# Patient Record
Sex: Female | Born: 1975 | Race: White | Hispanic: No | Smoking: Current every day smoker
Health system: Southern US, Academic
[De-identification: ages and names within clinical notes are randomized; demographics above are authoritative.]

## PROBLEM LIST (undated history)

## (undated) DIAGNOSIS — M797 Fibromyalgia: Secondary | ICD-10-CM

## (undated) DIAGNOSIS — F1120 Opioid dependence, uncomplicated: Secondary | ICD-10-CM

## (undated) DIAGNOSIS — E039 Hypothyroidism, unspecified: Secondary | ICD-10-CM

## (undated) HISTORY — PX: HX LAP CHOLECYSTECTOMY: SHX56

## (undated) HISTORY — PX: HX TUBAL LIGATION: SHX77

---

## 1995-07-21 ENCOUNTER — Ambulatory Visit (HOSPITAL_COMMUNITY): Payer: Self-pay | Admitting: Obstetrics & Gynecology

## 1996-02-09 ENCOUNTER — Other Ambulatory Visit: Payer: Self-pay

## 2003-01-14 ENCOUNTER — Emergency Department (HOSPITAL_COMMUNITY): Payer: Self-pay | Admitting: EMERGENCY MEDICINE

## 2003-04-05 ENCOUNTER — Emergency Department (HOSPITAL_COMMUNITY): Payer: Self-pay

## 2004-06-08 ENCOUNTER — Other Ambulatory Visit: Payer: Self-pay | Admitting: Obstetrics & Gynecology

## 2004-07-20 ENCOUNTER — Inpatient Hospital Stay (HOSPITAL_COMMUNITY): Payer: Self-pay | Admitting: Hospital-Specialty Hospital

## 2005-07-12 ENCOUNTER — Other Ambulatory Visit: Payer: Self-pay | Admitting: Obstetrics & Gynecology

## 2017-01-16 ENCOUNTER — Other Ambulatory Visit (HOSPITAL_COMMUNITY): Payer: Self-pay | Admitting: FAMILY PRACTICE

## 2017-01-16 ENCOUNTER — Other Ambulatory Visit
Admission: RE | Admit: 2017-01-16 | Discharge: 2017-01-16 | Disposition: A | Discharge status: Home / Self Care | Payer: HMO | Source: Ambulatory Visit | Attending: FAMILY PRACTICE | Admitting: FAMILY PRACTICE

## 2017-01-16 DIAGNOSIS — Z0000 Encounter for general adult medical examination without abnormal findings: Secondary | ICD-10-CM

## 2017-01-16 DIAGNOSIS — R61 Generalized hyperhidrosis: Secondary | ICD-10-CM

## 2017-01-16 DIAGNOSIS — E039 Hypothyroidism, unspecified: Secondary | ICD-10-CM

## 2017-01-16 LAB — CBC/DIFF - CLIENT CONSOLIDATED
BASOPHIL #: 0.03 x10ˆ3/uL (ref 0.00–0.20)
BASOPHIL %: 0 %
EOSINOPHIL #: 0.14 x10ˆ3/uL (ref 0.00–0.50)
EOSINOPHIL %: 2 %
HCT: 40.5 % (ref 35.9–44.6)
HGB: 13.5 g/dL (ref 12.0–15.3)
LYMPHOCYTE #: 2.98 x10ˆ3/uL (ref 0.90–4.00)
LYMPHOCYTE %: 36 %
MCH: 30.6 pg (ref 27.5–33.2)
MCHC: 33.3 g/dL (ref 31.6–35.5)
MCV: 92 fL (ref 80.0–96.0)
MONOCYTE #: 0.42 x10ˆ3/uL (ref 0.00–0.80)
MONOCYTE %: 5 %
MPV: 10.2 fL (ref 6.7–10.4)
NEUTROPHIL #: 4.76 x10ˆ3/uL (ref 1.50–7.50)
NEUTROPHIL %: 57 %
PLATELETS: 187 x10ˆ3/uL (ref 150–450)
RBC: 4.4 x10ˆ6/uL (ref 4.40–5.40)
RDW: 14.4 % — ABNORMAL HIGH (ref 10.5–13.5)
WBC: 8.3 x10ˆ3/uL
WBC: 8.3 x10ˆ3/uL (ref 4.4–11.0)

## 2017-01-16 LAB — CBC WITH DIFF
BASOPHIL #: 0.03 10*3/uL (ref 0.00–0.20)
BASOPHIL %: 0 %
EOSINOPHIL #: 0.14 10*3/uL (ref 0.00–0.50)
EOSINOPHIL %: 2 %
HCT: 40.5 % (ref 35.9–44.6)
HGB: 13.5 g/dL (ref 12.0–15.3)
LYMPHOCYTE #: 2.98 x10ˆ3/uL (ref 0.90–4.00)
LYMPHOCYTE %: 36 %
LYMPHOCYTE %: 36 %
MCH: 30.6 pg (ref 27.5–33.2)
MCHC: 33.3 g/dL (ref 31.6–35.5)
MCV: 92 fL (ref 80.0–96.0)
MONOCYTE #: 0.42 x10ˆ3/uL (ref 0.00–0.80)
MONOCYTE %: 5 %
MPV: 10.2 fL (ref 6.7–10.4)
NEUTROPHIL #: 4.76 x10ˆ3/uL (ref 1.50–7.50)
NEUTROPHIL %: 57 %
PLATELETS: 187 x10ˆ3/uL (ref 150–450)
RBC: 4.4 10*6/uL (ref 4.40–5.40)
RDW: 14.4 % — ABNORMAL HIGH (ref 10.5–13.5)
WBC: 8.3 10*3/uL (ref 4.4–11.0)

## 2017-01-16 LAB — LIPID PANEL
CHOL/HDL RATIO: 5.3 — ABNORMAL HIGH (ref 0.0–4.4)
CHOLESTEROL: 213 mg/dL — ABNORMAL HIGH (ref 0–199)
HDL CHOL: 40 mg/dL (ref 38–85)
LDL CALC: 120 mg/dL — ABNORMAL HIGH (ref 0–100)
TRIGLYCERIDES: 264 mg/dL — ABNORMAL HIGH (ref 0–149)
VLDL CALC: 53 mg/dL — ABNORMAL HIGH (ref 6–49)

## 2017-01-16 LAB — COMPREHENSIVE METABOLIC PANEL, NON-FASTING
ALBUMIN/GLOBULIN RATIO: 1.1 (ref 1.1–2.2)
ALBUMIN: 4.1 g/dL (ref 3.5–5.0)
ALKALINE PHOSPHATASE: 66 U/L (ref 38–126)
ALT (SGPT): 98 U/L — ABNORMAL HIGH (ref 14–54)
ANION GAP: 10 mmol/L (ref 5–15)
AST (SGOT): 93 U/L — ABNORMAL HIGH (ref 15–41)
BILIRUBIN TOTAL: 0.1 mg/dL — ABNORMAL LOW (ref 0.3–1.2)
BUN/CREA RATIO: 24 — ABNORMAL HIGH (ref 6–20)
BUN: 24 mg/dL (ref 8–26)
CALCIUM: 9.1 mg/dL (ref 8.9–10.3)
CHLORIDE: 104 mmol/L (ref 101–111)
CO2 TOTAL: 25 mmol/L (ref 22–32)
CREATININE: 1.01 mg/dL (ref 0.60–1.10)
ESTIMATED GFR: 60 mL/min/1.73mˆ2 — ABNORMAL LOW (ref >60)
GLUCOSE: 101 mg/dL (ref 70–110)
POTASSIUM: 4 mmol/L (ref 3.6–5.1)
PROTEIN TOTAL: 7.7 g/dL (ref 6.4–8.3)
SODIUM: 139 mmol/L (ref 136–144)

## 2017-01-16 LAB — THYROID STIMULATING HORMONE (SENSITIVE TSH): TSH: 2.8 u[IU]/mL (ref 0.340–5.600)

## 2017-01-16 LAB — HGA1C (HEMOGLOBIN A1C WITH EST AVG GLUCOSE)
ESTIMATED AVERAGE GLUCOSE: 111 mg/dL
HEMOGLOBIN A1C: 5.5 % (ref 4.3–6.1)

## 2017-01-16 LAB — FSH: FSH: 4.1 IU/L

## 2017-01-16 LAB — LH: LUTEINIZING HORMONE: 3.7 IU/L (ref 1.2–8.6)

## 2017-01-18 LAB — LDL CHOLESTEROL, DIRECT: LDL DIRECT: 145 mg/dL — ABNORMAL HIGH (ref <=100)

## 2017-01-20 ENCOUNTER — Other Ambulatory Visit (HOSPITAL_COMMUNITY): Payer: Self-pay | Admitting: FAMILY PRACTICE

## 2017-01-20 ENCOUNTER — Other Ambulatory Visit (HOSPITAL_COMMUNITY)
Admit: 2017-01-20 | Discharge: 2017-01-20 | Disposition: A | Discharge status: Home / Self Care | Payer: Self-pay | Attending: FAMILY PRACTICE | Admitting: FAMILY PRACTICE

## 2017-01-20 DIAGNOSIS — R748 Abnormal levels of other serum enzymes: Secondary | ICD-10-CM

## 2017-12-02 ENCOUNTER — Other Ambulatory Visit: Payer: Self-pay

## 2017-12-02 ENCOUNTER — Encounter (HOSPITAL_COMMUNITY): Payer: Self-pay

## 2017-12-02 ENCOUNTER — Emergency Department
Admission: EM | Admit: 2017-12-02 | Discharge: 2017-12-02 | Disposition: A | Discharge status: Home / Self Care | Payer: HMO | Attending: FAMILY PRACTICE | Admitting: FAMILY PRACTICE

## 2017-12-02 DIAGNOSIS — F17210 Nicotine dependence, cigarettes, uncomplicated: Secondary | ICD-10-CM | POA: Insufficient documentation

## 2017-12-02 DIAGNOSIS — K047 Periapical abscess without sinus: Secondary | ICD-10-CM | POA: Insufficient documentation

## 2017-12-02 MED ORDER — AMOXICILLIN 875 MG-POTASSIUM CLAVULANATE 125 MG TABLET
1.00 | ORAL_TABLET | ORAL | Status: AC
Start: 2017-12-02 — End: 2017-12-02
  Administered 2017-12-02: 1 via ORAL
  Filled 2017-12-02: qty 1

## 2017-12-02 MED ORDER — AMOXICILLIN 875 MG-POTASSIUM CLAVULANATE 125 MG TABLET
1.00 | ORAL_TABLET | Freq: Two times a day (BID) | ORAL | 0 refills | Status: AC
Start: 2017-12-02 — End: 2017-12-12

## 2017-12-02 MED ORDER — KETOROLAC 10 MG TABLET
10.00 mg | ORAL_TABLET | Freq: Three times a day (TID) | ORAL | 0 refills | Status: AC | PRN
Start: 2017-12-02 — End: ?

## 2017-12-02 MED ORDER — KETOROLAC 30 MG/ML (1 ML) INJECTION SOLUTION
30.00 mg | INTRAMUSCULAR | Status: AC
Start: 2017-12-02 — End: 2017-12-02
  Administered 2017-12-02: 30 mg via INTRAMUSCULAR
  Filled 2017-12-02: qty 1

## 2017-12-02 NOTE — Discharge Instructions (Addendum)
Follow up with local dentist as soon as possible. Take medication as prescribed.

## 2017-12-02 NOTE — ED Provider Notes (Signed)
Department of Emergency Medicine  [facility]  12/02/2017        Patient is a 42 y.o.  female presenting to the ED with chief complaint of toothache.     Location: left mandibular tooth  Quality: achy pain  Onset: 3-4 days  Severity: moderate to severe  Timing: still present  Context: poor dentition  Modifying factors: worse with chewing  Associated symptoms: tried Ora-gel; no help  Plans to see a dentist soon    Review of Systems:  As above    Past Medical History:  Past Medical History:  History reviewed. No pertinent past medical history.    Past Surgical History:  History reviewed. No pertinent surgical history.    Social History:  Social History     Tobacco Use   . Smoking status: Current Every Day Smoker     Packs/day: 0.50   . Smokeless tobacco: Never Used   Substance Use Topics   . Alcohol use: Not Currently   . Drug use: Not on file     Social History     Substance and Sexual Activity   Drug Use Not on file       Family History:  No family history on file.      Above history reviewed.  Allergies, medication list, and old records also reviewed.     Physical Exam:  Filed Vitals:    12/02/17 2023   BP: 100/72   Pulse: (!) 110   Resp: 18   Temp: 36.2 C (97.1 F)   SpO2: 97%     Nursing note and vitals reviewed.  Vital signs reviewed as above. No acute distress.   Constitutional: Patient is well-developed and well-nourished.   Head: Normocephalic and atraumatic. Mild swelling of left jaw and tender to palpation.  Eyes: Conjunctivae are normal. Pupils are equal, round, and reactive to light. EOM are intact.  Mouth: Poor dentition with tenderness of left mandibular molars  Neck: Soft, supple, full range of motion.  Pulmonary/Chest: No respiratory distress  Musculoskeletal: Normal range of motion. No deformities.    Neurological: CNs 2-12 grossly intact.  No focal deficits noted.  Skin: No rash or lesions      Workup:     Imaging:         Orders Placed This Encounter   .  amoxicillin-clavulanate (AUGMENTIN) 875-125mg  per tablet   . amoxicillin-pot clavulanate (AUGMENTIN) 875-125 mg Oral Tablet   . ketorolac (TORADOL) 30 mg/mL injection   . ketorolac tromethamine (TORADOL) 10 mg Oral Tablet     MDM:   During the patient's stay in the emergency department, the above listed imaging and/or labs were performed to assist with medical decision making and were reviewed by myself when available for review.     Patient remained stable throughout the emergency department course.      Impression:   1. Left mandibular molar dental abscess    Plan:   RX: Augmentin 875/125 PO BID x 10 days  RX: Toradol 10 mg PO TID prn pain #12  Follow up dentist ASAP

## 2017-12-02 NOTE — ED Nurses Note (Signed)
Patient discharge to home.   Discharge instructions reviewed.  Prescriptions reviewed.

## 2017-12-02 NOTE — ED Triage Notes (Signed)
Toothache on left side of face for three days.  Increased pain at night.

## 2018-08-24 LAB — HISTORICAL SURGICAL PATHOLOGY SPECIMEN

## 2018-12-19 ENCOUNTER — Other Ambulatory Visit: Payer: Self-pay

## 2018-12-19 ENCOUNTER — Other Ambulatory Visit (HOSPITAL_COMMUNITY): Payer: Self-pay

## 2018-12-19 DIAGNOSIS — I4581 Long QT syndrome: Secondary | ICD-10-CM

## 2018-12-21 ENCOUNTER — Ambulatory Visit
Admission: RE | Admit: 2018-12-21 | Discharge: 2018-12-21 | Disposition: A | Discharge status: Home / Self Care | Payer: MEDICAID | Source: Ambulatory Visit | Attending: MULTISPECIALTY CLINIC OR GROUP PRACTICE | Admitting: MULTISPECIALTY CLINIC OR GROUP PRACTICE

## 2018-12-21 ENCOUNTER — Encounter (HOSPITAL_COMMUNITY): Payer: Self-pay

## 2018-12-21 DIAGNOSIS — I4581 Long QT syndrome: Secondary | ICD-10-CM | POA: Insufficient documentation

## 2018-12-22 ENCOUNTER — Other Ambulatory Visit: Payer: Self-pay

## 2021-06-17 ENCOUNTER — Inpatient Hospital Stay (HOSPITAL_COMMUNITY): Payer: MEDICAID | Admitting: Pediatrics

## 2021-06-17 ENCOUNTER — Encounter (HOSPITAL_COMMUNITY): Payer: Self-pay

## 2021-06-17 ENCOUNTER — Inpatient Hospital Stay
Admission: AD | Admit: 2021-06-17 | Discharge: 2021-06-21 | DRG: 863 | Disposition: A | Discharge status: Home Health | Payer: MEDICAID | Source: Other Acute Inpatient Hospital | Attending: Internal Medicine | Admitting: Internal Medicine

## 2021-06-17 ENCOUNTER — Encounter (HOSPITAL_COMMUNITY): Payer: Self-pay | Admitting: Pediatrics

## 2021-06-17 ENCOUNTER — Inpatient Hospital Stay (HOSPITAL_COMMUNITY): Payer: MEDICAID

## 2021-06-17 DIAGNOSIS — S42301S Unspecified fracture of shaft of humerus, right arm, sequela: Secondary | ICD-10-CM

## 2021-06-17 DIAGNOSIS — S51801A Unspecified open wound of right forearm, initial encounter: Secondary | ICD-10-CM

## 2021-06-17 DIAGNOSIS — K5900 Constipation, unspecified: Secondary | ICD-10-CM | POA: Diagnosis present

## 2021-06-17 DIAGNOSIS — N3000 Acute cystitis without hematuria: Secondary | ICD-10-CM | POA: Diagnosis present

## 2021-06-17 DIAGNOSIS — N390 Urinary tract infection, site not specified: Secondary | ICD-10-CM

## 2021-06-17 DIAGNOSIS — S42252A Displaced fracture of greater tuberosity of left humerus, initial encounter for closed fracture: Secondary | ICD-10-CM | POA: Diagnosis present

## 2021-06-17 DIAGNOSIS — L089 Local infection of the skin and subcutaneous tissue, unspecified: Secondary | ICD-10-CM

## 2021-06-17 DIAGNOSIS — S43005S Unspecified dislocation of left shoulder joint, sequela: Secondary | ICD-10-CM

## 2021-06-17 DIAGNOSIS — E876 Hypokalemia: Secondary | ICD-10-CM | POA: Diagnosis present

## 2021-06-17 DIAGNOSIS — T8149XA Infection following a procedure, other surgical site, initial encounter: Principal | ICD-10-CM | POA: Diagnosis present

## 2021-06-17 DIAGNOSIS — F1121 Opioid dependence, in remission: Secondary | ICD-10-CM | POA: Diagnosis present

## 2021-06-17 DIAGNOSIS — B965 Pseudomonas (aeruginosa) (mallei) (pseudomallei) as the cause of diseases classified elsewhere: Secondary | ICD-10-CM | POA: Diagnosis present

## 2021-06-17 DIAGNOSIS — M869 Osteomyelitis, unspecified: Secondary | ICD-10-CM

## 2021-06-17 DIAGNOSIS — D62 Acute posthemorrhagic anemia: Secondary | ICD-10-CM | POA: Diagnosis present

## 2021-06-17 DIAGNOSIS — M797 Fibromyalgia: Secondary | ICD-10-CM

## 2021-06-17 DIAGNOSIS — F17210 Nicotine dependence, cigarettes, uncomplicated: Secondary | ICD-10-CM | POA: Diagnosis present

## 2021-06-17 DIAGNOSIS — E039 Hypothyroidism, unspecified: Secondary | ICD-10-CM

## 2021-06-17 DIAGNOSIS — E038 Other specified hypothyroidism: Secondary | ICD-10-CM | POA: Diagnosis present

## 2021-06-17 DIAGNOSIS — B9620 Unspecified Escherichia coli [E. coli] as the cause of diseases classified elsewhere: Secondary | ICD-10-CM

## 2021-06-17 DIAGNOSIS — F32A Depression, unspecified: Secondary | ICD-10-CM | POA: Diagnosis present

## 2021-06-17 DIAGNOSIS — S42301A Unspecified fracture of shaft of humerus, right arm, initial encounter for closed fracture: Secondary | ICD-10-CM | POA: Diagnosis present

## 2021-06-17 HISTORY — DX: Fibromyalgia: M79.7

## 2021-06-17 HISTORY — DX: Hypothyroidism, unspecified: E03.9

## 2021-06-17 HISTORY — DX: Opioid dependence, uncomplicated (CMS HCC): F11.20

## 2021-06-17 LAB — CBC WITH DIFF
BASOPHIL #: <0.1 10*3/uL (ref <=0.20)
BASOPHIL %: 0 %
EOSINOPHIL #: <0.1 10*3/uL (ref <=0.50)
EOSINOPHIL %: 1 %
HCT: 26.2 % — ABNORMAL LOW (ref 34.8–46.0)
HGB: 8.3 g/dL — ABNORMAL LOW (ref 11.5–16.0)
IMMATURE GRANULOCYTE #: <0.1 10*3/uL (ref <0.10)
IMMATURE GRANULOCYTE %: 1 % (ref 0–1)
LYMPHOCYTE #: 2.5 10*3/uL (ref 1.00–4.80)
LYMPHOCYTE %: 27 %
MCH: 29.1 pg (ref 26.0–32.0)
MCHC: 31.7 g/dL (ref 31.0–35.5)
MCV: 91.9 fL (ref 78.0–100.0)
MONOCYTE #: 0.47 10*3/uL (ref 0.20–1.10)
MONOCYTE %: 5 %
MPV: 10.5 fL (ref 8.7–12.5)
NEUTROPHIL #: 6.17 10*3/uL (ref 1.50–7.70)
NEUTROPHIL %: 66 %
PLATELETS: 236 10*3/uL (ref 150–400)
RBC: 2.85 10*6/uL — ABNORMAL LOW (ref 3.85–5.22)
RDW-CV: 14.6 % (ref 11.5–15.5)
WBC: 9.3 10*3/uL (ref 3.7–11.0)

## 2021-06-17 LAB — HEPATIC FUNCTION PANEL
ALBUMIN: 2.8 g/dL — ABNORMAL LOW (ref 3.5–5.0)
ALKALINE PHOSPHATASE: 101 U/L (ref 40–110)
ALT (SGPT): 17 U/L (ref 8–22)
AST (SGOT): 19 U/L (ref 8–45)
BILIRUBIN DIRECT: 0.1 mg/dL (ref 0.1–0.4)
BILIRUBIN TOTAL: 0.2 mg/dL — ABNORMAL LOW (ref 0.3–1.3)
PROTEIN TOTAL: 7.3 g/dL (ref 6.4–8.3)

## 2021-06-17 LAB — BASIC METABOLIC PANEL
ANION GAP: 14 mmol/L — ABNORMAL HIGH (ref 4–13)
BUN/CREA RATIO: 8 (ref 6–22)
BUN: 9 mg/dL (ref 8–25)
CALCIUM: 9.8 mg/dL (ref 8.5–10.0)
CHLORIDE: 102 mmol/L (ref 96–111)
CO2 TOTAL: 27 mmol/L (ref 22–30)
CREATININE: 1.07 mg/dL — ABNORMAL HIGH (ref 0.60–1.05)
ESTIMATED GFR: 64 mL/min/BSA (ref >=60)
GLUCOSE: 83 mg/dL (ref 65–125)
POTASSIUM: 3.1 mmol/L — ABNORMAL LOW (ref 3.5–5.1)
SODIUM: 143 mmol/L (ref 136–145)

## 2021-06-17 LAB — MAGNESIUM: MAGNESIUM: 1.9 mg/dL (ref 1.8–2.6)

## 2021-06-17 LAB — THYROID STIMULATING HORMONE WITH FREE T4 REFLEX: TSH: 3.071 u[IU]/mL (ref 0.430–3.550)

## 2021-06-17 LAB — C-REACTIVE PROTEIN(CRP),INFLAMMATION: CRP INFLAMMATION: 84.3 mg/L — ABNORMAL HIGH (ref <8.0)

## 2021-06-17 LAB — SEDIMENTATION RATE: ERYTHROCYTE SEDIMENTATION RATE (ESR): 110 mm/hr — ABNORMAL HIGH (ref 0–20)

## 2021-06-17 MED ORDER — PIPERACILLIN-TAZOBACTAM 4.5 GRAM INTRAVENOUS SOLUTION
4.5000 g | INTRAVENOUS | Status: AC
Start: 2021-06-17 — End: 2021-06-17
  Administered 2021-06-17: 0 g via INTRAVENOUS
  Administered 2021-06-17: 4.5 g via INTRAVENOUS
  Filled 2021-06-17: qty 20

## 2021-06-17 MED ORDER — SENNOSIDES 8.6 MG-DOCUSATE SODIUM 50 MG TABLET
1.0000 | ORAL_TABLET | Freq: Two times a day (BID) | ORAL | Status: DC
Start: 2021-06-18 — End: 2021-06-21
  Administered 2021-06-18 – 2021-06-21 (×7): 1 via ORAL
  Filled 2021-06-17 (×7): qty 1

## 2021-06-17 MED ORDER — POTASSIUM CHLORIDE ER 20 MEQ TABLET,EXTENDED RELEASE(PART/CRYST)
40.0000 meq | ORAL_TABLET | ORAL | Status: AC
Start: 2021-06-17 — End: 2021-06-17
  Administered 2021-06-17: 40 meq via ORAL
  Filled 2021-06-17: qty 2

## 2021-06-17 MED ORDER — SODIUM CHLORIDE 0.9 % (FLUSH) INJECTION SYRINGE
2.0000 mL | INJECTION | INTRAMUSCULAR | Status: DC | PRN
Start: 2021-06-17 — End: 2021-06-21

## 2021-06-17 MED ORDER — OXYCODONE 10 MG TABLET
10.0000 mg | ORAL_TABLET | ORAL | Status: DC | PRN
Start: 2021-06-17 — End: 2021-06-18
  Administered 2021-06-17 – 2021-06-18 (×2): 10 mg via ORAL
  Filled 2021-06-17 (×2): qty 1

## 2021-06-17 MED ORDER — ACETAMINOPHEN 325 MG TABLET
650.0000 mg | ORAL_TABLET | Freq: Four times a day (QID) | ORAL | Status: DC
Start: 2021-06-18 — End: 2021-06-21
  Administered 2021-06-17 – 2021-06-18 (×4): 650 mg via ORAL
  Administered 2021-06-19: 0 mg via ORAL
  Administered 2021-06-19 (×2): 650 mg via ORAL
  Administered 2021-06-19: 0 mg via ORAL
  Administered 2021-06-19 – 2021-06-21 (×8): 650 mg via ORAL
  Filled 2021-06-17 (×14): qty 2

## 2021-06-17 MED ORDER — SODIUM CHLORIDE 0.9 % (FLUSH) INJECTION SYRINGE
2.0000 mL | INJECTION | Freq: Three times a day (TID) | INTRAMUSCULAR | Status: DC
Start: 2021-06-17 — End: 2021-06-21
  Administered 2021-06-17: 6 mL
  Administered 2021-06-18: 2 mL
  Administered 2021-06-18: 0 mL
  Administered 2021-06-18: 6 mL
  Administered 2021-06-19: 2 mL
  Administered 2021-06-19: 3 mL
  Administered 2021-06-19: 2 mL
  Administered 2021-06-20: 0 mL
  Administered 2021-06-20 (×2): 2 mL
  Administered 2021-06-21: 3 mL
  Administered 2021-06-21: 2 mL

## 2021-06-17 MED ORDER — DEXTROSE 5% IN WATER (D5W) FLUSH BAG - 250 ML
INTRAVENOUS | Status: DC | PRN
Start: 2021-06-17 — End: 2021-06-21

## 2021-06-17 MED ORDER — HYDROMORPHONE 1 MG/ML INJECTION WRAPPER
0.2000 mg | INJECTION | INTRAMUSCULAR | Status: DC | PRN
Start: 2021-06-17 — End: 2021-06-18
  Administered 2021-06-18 (×2): 0.2 mg via INTRAVENOUS
  Filled 2021-06-17 (×2): qty 1

## 2021-06-17 MED ORDER — OXYCODONE 5 MG TABLET
5.0000 mg | ORAL_TABLET | ORAL | Status: DC | PRN
Start: 2021-06-17 — End: 2021-06-18

## 2021-06-17 MED ORDER — HEPARIN (PORCINE) 5,000 UNIT/ML INJECTION SOLUTION
5000.0000 [IU] | Freq: Three times a day (TID) | INTRAMUSCULAR | Status: DC
Start: 2021-06-18 — End: 2021-06-18
  Administered 2021-06-18: 5000 [IU] via SUBCUTANEOUS
  Filled 2021-06-17: qty 1

## 2021-06-17 MED ORDER — ONDANSETRON HCL (PF) 4 MG/2 ML INJECTION SOLUTION
4.0000 mg | Freq: Four times a day (QID) | INTRAMUSCULAR | Status: DC | PRN
Start: 2021-06-17 — End: 2021-06-21

## 2021-06-17 MED ORDER — ACETAMINOPHEN 325 MG TABLET
650.0000 mg | ORAL_TABLET | ORAL | Status: DC | PRN
Start: 2021-06-17 — End: 2021-06-17

## 2021-06-17 MED ORDER — PIPERACILLIN-TAZOBACTAM 4.5 GRAM INTRAVENOUS SOLUTION
4.5000 g | Freq: Three times a day (TID) | INTRAVENOUS | Status: DC
Start: 2021-06-18 — End: 2021-06-18
  Administered 2021-06-18: 4.5 g via INTRAVENOUS
  Administered 2021-06-18: 0 g via INTRAVENOUS
  Filled 2021-06-17: qty 20

## 2021-06-17 MED ORDER — POLYETHYLENE GLYCOL 3350 17 GRAM ORAL POWDER PACKET
17.0000 g | Freq: Every day | ORAL | Status: DC
Start: 2021-06-18 — End: 2021-06-20
  Administered 2021-06-18: 0 g via ORAL
  Administered 2021-06-19 – 2021-06-20 (×2): 17 g via ORAL
  Filled 2021-06-17 (×3): qty 1

## 2021-06-17 MED ORDER — SODIUM CHLORIDE 0.9% FLUSH BAG - 250 ML
INTRAVENOUS | Status: DC | PRN
Start: 2021-06-17 — End: 2021-06-21

## 2021-06-17 NOTE — Nurses Notes (Signed)
Page sent to Denzil Magnuson, MD, regarding pts pain level. Requested a call back.
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
## 2021-06-17 NOTE — Care Plan (Signed)
Problem: Adult Inpatient Plan of Care  Goal: Plan of Care Review  Outcome: Outcome Achieved  Goal: Patient-Specific Goal (Individualized)  Outcome: Outcome Achieved  Flowsheets (Taken 06/17/2021 2140)  Patient would like to participate in bedside shift report: Yes  Individualized Care Needs: pain control  Anxieties, Fears or Concerns: pain control  Patient-Specific Goals (Include Timeframe): pain control  Plan of Care Reviewed With: patient  Goal: Absence of Hospital-Acquired Illness or Injury  Outcome: Outcome Achieved  Intervention: Identify and Manage Fall Risk  Recent Flowsheet Documentation  Taken 06/17/2021 2140 by Brennan [name], RN  Safety Promotion/Fall Prevention: safety round/check completed  Intervention: Prevent Skin Injury  Recent Flowsheet Documentation  Taken 06/17/2021 2140 by Brennan [name], RN  Body Position: positioned/repositioned independently  Skin Protection: adhesive use limited  Intervention: Prevent and Manage VTE (Venous Thromboembolism) Risk  Recent Flowsheet Documentation  Taken 06/17/2021 2140 by Brennan [name], RN  VTE Prevention/Management:   ambulation promoted   compression stockings on  Intervention: Prevent Infection  Recent Flowsheet Documentation  Taken 06/17/2021 2140 by Brennan [name], RN  Infection Prevention:   promote handwashing   rest/sleep promoted  Goal: Optimal Comfort and Wellbeing  Outcome: Outcome Achieved  Intervention: Provide Person-Centered Care  Recent Flowsheet Documentation  Taken 06/17/2021 2140 by Brennan [name], RN  Trust Relationship/Rapport: care explained  Goal: Rounds/Family Conference  Outcome: Outcome Achieved     Problem: Fall Injury Risk  Goal: Absence of Fall and Fall-Related Injury  Outcome: Outcome Achieved  Intervention: Identify and Manage Contributors  Recent Flowsheet Documentation  Taken 06/17/2021 2140 by Brennan [name], RN  Self-Care Promotion: independence encouraged  Intervention: Promote Injury-Free Environment  Recent Flowsheet  Documentation  Taken 06/17/2021 2140 by Brennan [name], RN  Safety Promotion/Fall Prevention: safety round/check completed     Problem: Pain Acute  Goal: Optimal Pain Control and Function  Outcome: Outcome Achieved
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
## 2021-06-18 DIAGNOSIS — N3000 Acute cystitis without hematuria: Secondary | ICD-10-CM

## 2021-06-18 DIAGNOSIS — Z720 Tobacco use: Secondary | ICD-10-CM

## 2021-06-18 DIAGNOSIS — S42301A Unspecified fracture of shaft of humerus, right arm, initial encounter for closed fracture: Secondary | ICD-10-CM

## 2021-06-18 DIAGNOSIS — Z5181 Encounter for therapeutic drug level monitoring: Secondary | ICD-10-CM

## 2021-06-18 DIAGNOSIS — K5900 Constipation, unspecified: Secondary | ICD-10-CM

## 2021-06-18 DIAGNOSIS — Z6825 Body mass index (BMI) 25.0-25.9, adult: Secondary | ICD-10-CM

## 2021-06-18 DIAGNOSIS — F17200 Nicotine dependence, unspecified, uncomplicated: Secondary | ICD-10-CM

## 2021-06-18 LAB — CBC WITH DIFF
BASOPHIL #: <0.1 10*3/uL (ref <=0.20)
BASOPHIL %: 1 %
EOSINOPHIL #: 0.12 10*3/uL (ref <=0.50)
EOSINOPHIL %: 2 %
HCT: 25.5 % — ABNORMAL LOW (ref 34.8–46.0)
HGB: 7.8 g/dL — ABNORMAL LOW (ref 11.5–16.0)
IMMATURE GRANULOCYTE #: <0.1 10*3/uL (ref <0.10)
IMMATURE GRANULOCYTE %: 1 % (ref 0–1)
LYMPHOCYTE #: 2.35 10*3/uL (ref 1.00–4.80)
LYMPHOCYTE %: 29 %
MCH: 28.4 pg (ref 26.0–32.0)
MCHC: 30.6 g/dL — ABNORMAL LOW (ref 31.0–35.5)
MCV: 92.7 fL (ref 78.0–100.0)
MONOCYTE #: 0.37 10*3/uL (ref 0.20–1.10)
MONOCYTE %: 5 %
MPV: 10.6 fL (ref 8.7–12.5)
NEUTROPHIL #: 5.2 10*3/uL (ref 1.50–7.70)
NEUTROPHIL %: 62 %
PLATELETS: 212 10*3/uL (ref 150–400)
RBC: 2.75 10*6/uL — ABNORMAL LOW (ref 3.85–5.22)
RDW-CV: 14.9 % (ref 11.5–15.5)
WBC: 8.1 10*3/uL (ref 3.7–11.0)

## 2021-06-18 LAB — BASIC METABOLIC PANEL
ANION GAP: 8 mmol/L (ref 4–13)
BUN/CREA RATIO: 12 (ref 6–22)
BUN: 14 mg/dL (ref 8–25)
CALCIUM: 9.1 mg/dL (ref 8.5–10.0)
CHLORIDE: 104 mmol/L (ref 96–111)
CO2 TOTAL: 31 mmol/L — ABNORMAL HIGH (ref 22–30)
CREATININE: 1.13 mg/dL — ABNORMAL HIGH (ref 0.60–1.05)
ESTIMATED GFR: 60 mL/min/BSA (ref >=60)
GLUCOSE: 100 mg/dL (ref 65–125)
POTASSIUM: 3.9 mmol/L (ref 3.5–5.1)
SODIUM: 143 mmol/L (ref 136–145)

## 2021-06-18 LAB — ECG 12-LEAD
Atrial Rate: 72 {beats}/min
Calculated P Axis: 51 degrees
Calculated R Axis: 20 degrees
Calculated T Axis: 34 degrees
PR Interval: 124 ms
QRS Duration: 78 ms
QT Interval: 380 ms
QTC Calculation: 416 ms
Ventricular rate: 72 {beats}/min

## 2021-06-18 LAB — URINALYSIS, MICROSCOPIC
RBCS: 85 /hpf — ABNORMAL HIGH (ref <6.0)
WBCS: 4 /hpf (ref <11.0)

## 2021-06-18 LAB — URINALYSIS, MACROSCOPIC
BILIRUBIN: NEGATIVE mg/dL
COLOR: NORMAL
GLUCOSE: NEGATIVE mg/dL
KETONES: NEGATIVE mg/dL
LEUKOCYTES: NEGATIVE WBCs/uL
NITRITE: NEGATIVE
PH: 7 (ref 5.0–8.0)
PROTEIN: NEGATIVE mg/dL
SPECIFIC GRAVITY: 1.01 (ref 1.005–1.030)
UROBILINOGEN: NEGATIVE mg/dL

## 2021-06-18 LAB — HCG, PLASMA OR SERUM QUANTITATIVE, PREGNANCY: HCG QUANTITATIVE PREGNANCY: <2 m[IU]/mL (ref <10)

## 2021-06-18 MED ORDER — METHOCARBAMOL 750 MG TABLET
750.0000 mg | ORAL_TABLET | Freq: Four times a day (QID) | ORAL | Status: DC
Start: 2021-06-18 — End: 2021-06-21
  Administered 2021-06-18 – 2021-06-19 (×5): 750 mg via ORAL
  Administered 2021-06-19: 0 mg via ORAL
  Administered 2021-06-19 – 2021-06-20 (×2): 750 mg via ORAL
  Administered 2021-06-20: 0 mg via ORAL
  Administered 2021-06-20: 750 mg via ORAL
  Administered 2021-06-20: 0 mg via ORAL
  Administered 2021-06-21 (×3): 750 mg via ORAL
  Filled 2021-06-18 (×13): qty 1

## 2021-06-18 MED ORDER — OXYCODONE 20 MG/ML ORAL CONCENTRATE
10.0000 mg | ORAL | Status: DC | PRN
Start: 2021-06-18 — End: 2021-06-18
  Administered 2021-06-18: 10 mg via ORAL
  Filled 2021-06-18: qty 0.5

## 2021-06-18 MED ORDER — NICOTINE 7 MG/24 HR DAILY TRANSDERMAL PATCH
7.0000 mg | MEDICATED_PATCH | Freq: Every day | TRANSDERMAL | Status: DC
Start: 2021-06-18 — End: 2021-06-21
  Administered 2021-06-18 – 2021-06-21 (×4): 7 mg via TRANSDERMAL
  Filled 2021-06-18 (×4): qty 1

## 2021-06-18 MED ORDER — KETOROLAC 15 MG/ML INJECTION SOLUTION
15.0000 mg | Freq: Four times a day (QID) | INTRAMUSCULAR | Status: AC | PRN
Start: 2021-06-18 — End: 2021-06-21
  Administered 2021-06-18 – 2021-06-21 (×11): 15 mg via INTRAVENOUS
  Filled 2021-06-18 (×11): qty 1

## 2021-06-18 MED ORDER — METHADONE 5 MG TABLET
125.0000 mg | ORAL_TABLET | Freq: Every day | ORAL | Status: DC
Start: 2021-06-18 — End: 2021-06-18

## 2021-06-18 MED ORDER — CEFEPIME 2 GRAM SOLUTION FOR INJECTION
2.0000 g | Freq: Two times a day (BID) | INTRAMUSCULAR | Status: DC
Start: 2021-06-19 — End: 2021-06-21
  Administered 2021-06-19 (×2): 2 g via INTRAVENOUS
  Administered 2021-06-19 – 2021-06-20 (×3): 0 g via INTRAVENOUS
  Administered 2021-06-20 (×2): 2 g via INTRAVENOUS
  Administered 2021-06-20 – 2021-06-21 (×2): 0 g via INTRAVENOUS
  Administered 2021-06-21: 2 g via INTRAVENOUS
  Administered 2021-06-21: 0 g via INTRAVENOUS
  Administered 2021-06-21: 2 g via INTRAVENOUS
  Filled 2021-06-18 (×6): qty 12.5

## 2021-06-18 MED ORDER — LIDOCAINE 3.6 %-MENTHOL 1.25 % TOPICAL PATCH
1.0000 | MEDICATED_PATCH | Freq: Every day | CUTANEOUS | Status: DC
Start: 2021-06-18 — End: 2021-06-21
  Administered 2021-06-18 – 2021-06-21 (×4): 1 via TRANSDERMAL
  Filled 2021-06-18 (×4): qty 1

## 2021-06-18 MED ORDER — OXYCODONE 20 MG/ML ORAL CONCENTRATE
20.0000 mg | ORAL | Status: DC | PRN
Start: 2021-06-18 — End: 2021-06-21
  Administered 2021-06-19 – 2021-06-21 (×11): 20 mg via ORAL
  Filled 2021-06-18 (×11): qty 1

## 2021-06-18 MED ORDER — OXYCODONE 20 MG/ML ORAL CONCENTRATE
5.0000 mg | ORAL | Status: DC | PRN
Start: 2021-06-18 — End: 2021-06-18

## 2021-06-18 MED ORDER — MORPHINE CONCENTRATE 10 MG/0.5 ML ORAL SYRINGE (FOR ORAL USE ONLY)
25.0000 mg | INJECTION | ORAL | Status: DC | PRN
Start: 2021-06-18 — End: 2021-06-18

## 2021-06-18 MED ORDER — ENOXAPARIN 40 MG/0.4 ML SUBCUTANEOUS SYRINGE
40.0000 mg | INJECTION | SUBCUTANEOUS | Status: DC
Start: 2021-06-18 — End: 2021-06-21
  Administered 2021-06-18 – 2021-06-21 (×4): 40 mg via SUBCUTANEOUS
  Filled 2021-06-18 (×4): qty 0.4

## 2021-06-18 MED ORDER — METHADONE 10 MG/ML ORAL CONCENTRATE
125.0000 mg | Freq: Every day | ORAL | Status: DC
Start: 2021-06-18 — End: 2021-06-21
  Administered 2021-06-18 – 2021-06-21 (×4): 125 mg via ORAL
  Filled 2021-06-18 (×4): qty 15

## 2021-06-18 MED ORDER — OXYCODONE 20 MG/ML ORAL CONCENTRATE
15.0000 mg | ORAL | Status: DC | PRN
Start: 2021-06-18 — End: 2021-06-21
  Administered 2021-06-18 – 2021-06-19 (×3): 15 mg via ORAL
  Filled 2021-06-18 (×3): qty 1

## 2021-06-18 MED ORDER — SODIUM CHLORIDE 0.9 % INTRAVENOUS PIGGYBACK
2.0000 g | INJECTION | INTRAVENOUS | Status: AC
Start: 2021-06-18 — End: 2021-06-18
  Administered 2021-06-18: 0 g via INTRAVENOUS
  Administered 2021-06-18: 2 g via INTRAVENOUS
  Filled 2021-06-18: qty 12.5

## 2021-06-18 MED ORDER — NICOTINE (POLACRILEX) 2 MG GUM
2.0000 mg | CHEWING_GUM | BUCCAL | Status: DC | PRN
Start: 2021-06-18 — End: 2021-06-21

## 2021-06-18 MED ORDER — METRONIDAZOLE 250 MG TABLET
500.0000 mg | ORAL_TABLET | Freq: Two times a day (BID) | ORAL | Status: DC
Start: 2021-06-18 — End: 2021-06-21
  Administered 2021-06-18 – 2021-06-21 (×7): 500 mg via ORAL
  Filled 2021-06-18 (×7): qty 2

## 2021-06-18 MED ORDER — MORPHINE CONCENTRATE 10 MG/0.5 ML ORAL SYRINGE (FOR ORAL USE ONLY)
20.0000 mg | INJECTION | ORAL | Status: DC | PRN
Start: 2021-06-18 — End: 2021-06-18
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
## 2021-06-18 NOTE — Transitional Care (Addendum)
Per Hospitalist service, records for cultures requested from [place] for the past month.  Called [phone] and left a message and request sent via fax as well to [phone].  Pollyann Glen, LPN  03/30/5619, 30:86      Your fax to [phone] was completed successfully.    To: Jetty Peeks  ([id number])  Pages: 1           JobID: [id number]    Records received and scanned into media.  Hospitalist service notified via secure chat.  Pollyann Glen, LPN  04/24/6438, 34:74

## 2021-06-18 NOTE — Pharmacy (Addendum)
Methadone Dose Confirmation    [facility]- [phone] on 06/18/21:        - Confirmed Methadone dose (with [facility]): 125 mg daily       - Per clinic, patient last seen: 05/22/21, received 27 take home doses at that time.       - Per patient last dose taken: 06/17/21 @ 1000    Nani Skillern, [place]  06/18/2021, 11:16

## 2021-06-18 NOTE — Care Plan (Signed)
Goal: Activity - ambulate OOB, PT/OT, ROM  Goal: Safety - OOB with FWW, fall prevention education, call bell within reach  Goal: Infection control - IV abx therapy, hand hygiene, patient education  Goal: Pain control - Reposition, PRN Oxycodone/Toradol  Goal: Skin integrity - encourage frequent weight shift, nutritional intake  Goal: DVT prevention - Ambulation, SCD's, TED hose, Lovenox  Goal: D/C - pending abx      Patient admitted for right humerus I&D. Patient alert and oriented. VSS. +CMS in all extremities.  Dressing to right humerus cdi. Patient pain being managed with PRN Oxycodone. Patient ambulates OOB ad lib. Patient tolerating diet well with no c/o nausea. Patient is tolerating IV ABX well. PT/OT recommending home. Patient educated on policies. POC reviewed. Call bell within reach. D/C pending abx.      Problem: Orthopaedic Fracture  Goal: Optimal Functional Ability  Intervention: Optimize Functional Ability  Recent Flowsheet Documentation  Taken 06/18/2021 1608 by Cristina Gong, RN  Self-Care Promotion:   independence encouraged   BADL personal objects within reach
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
## 2021-06-18 NOTE — Care Management Notes (Signed)
[facility] Management Initial Evaluation    Patient Name: Brenda [name]  Date of Birth: [birth year]  Sex: female  Date/Time of Admission: 06/17/2021  9:07 PM  Room/Bed: 11/A  Payor: [place] / Plan: HEALTH PLAN MEDICAID / Product Type: Medicaid [place] /   Primary Care Providers:  Talbert Cage, MD, MD (General)    Pharmacy Info:   Preferred [facility] [address] [place]    [place] [address]    Phone: [phone] Fax: [phone]    Hours: Not open 24 hours          Emergency Contact Info:   Extended Emergency Contact Information  Primary Emergency Contact: [facility]  Mobile Phone: [phone]  Relation: Daughter  Preferred language: English  Interpreter needed? No  Secondary Emergency Contact: [place] Phone: [phone]  Work Phone: [phone]  Mobile Phone: [phone]  Relation: Other  Preferred language: [place] needed? No    History:   Brenda [name] is a 46 y.o., female, admitted with Infection of humerus.    Height/Weight: 162.6 cm (5' 4.02") / 68.3 kg (150 lb 9.2 oz)     LOS: 1 day   Admitting Diagnosis: Infection of humerus (CMS [facility]) [M86.9]    Assessment:      06/18/21 0913   Assessment Details   Assessment Type Admission   Date of Care Management Update 06/18/21   Date of Next DCP Update 06/21/21   Readmission   Is this a readmission? No   Insurance Information/Type   Insurance type Medicaid   Employment/Financial   Patient has Prescription Coverage?  Yes   Living Environment   Lives With parent(s)   Living Arrangements house   Able to Return to Prior Arrangements yes   Home Safety   Home Assessment: No Problems Identified   Home Accessibility no concerns   Care Management Plan   Discharge Planning Status initial meeting   Projected Discharge Date 06/21/21   Discharge plan discussed with: Patient   CM will  evaluate for rehabilitation potential no   Patient choice offered to patient/family no   Form for patient choice reviewed/signed and on chart no   Patient aware of possible cost for ambulance transport?  No   Discharge Needs Assessment   Equipment Currently Used at Home none   Equipment Needed After Discharge none   Discharge Facility/Level of Care Needs Home (Patient/Family Member/other)(code 1)   Transportation Available car;family or friend will provide   Referral Information   Admission Type inpatient   Address Verified verified-no changes   Arrived From home or self-care;admitted as an inpatient   ADVANCE DIRECTIVES   Does the Patient have an Advance Directive? No, Information Offered and Refused   Patient Requests Assistance in Having Advance Directive Notarized. N/A   LAY CAREGIVER    Appointed Lay Caregiver? I Decline         Discharge Plan:  Home (Patient/Family Member/other) (code 1)  Patient resides with her mother in a 3 story home. No DME or Home Health services prior to admission.  MSW confirmed address, No pharmacy since closed, and PCP information. Patient has prescription coverage and transportation home upon discharge via mother.      The patient will continue to be evaluated for developing discharge needs.     Case Manager: Quillian Quince  [place], [place]  Phone: [phone]

## 2021-06-19 DIAGNOSIS — S42252A Displaced fracture of greater tuberosity of left humerus, initial encounter for closed fracture: Secondary | ICD-10-CM

## 2021-06-19 DIAGNOSIS — F1120 Opioid dependence, uncomplicated: Secondary | ICD-10-CM

## 2021-06-19 DIAGNOSIS — S49001G Unspecified physeal fracture of upper end of humerus, right arm, subsequent encounter for fracture with delayed healing: Secondary | ICD-10-CM

## 2021-06-19 DIAGNOSIS — Z9889 Other specified postprocedural states: Secondary | ICD-10-CM

## 2021-06-19 DIAGNOSIS — D649 Anemia, unspecified: Secondary | ICD-10-CM

## 2021-06-19 DIAGNOSIS — M79601 Pain in right arm: Secondary | ICD-10-CM

## 2021-06-19 LAB — CBC WITH DIFF
BASOPHIL #: <0.1 10*3/uL (ref <=0.20)
BASOPHIL %: 1 %
EOSINOPHIL #: 0.16 10*3/uL (ref <=0.50)
EOSINOPHIL %: 2 %
HCT: 27.1 % — ABNORMAL LOW (ref 34.8–46.0)
HGB: 8.2 g/dL — ABNORMAL LOW (ref 11.5–16.0)
IMMATURE GRANULOCYTE #: <0.1 10*3/uL (ref <0.10)
IMMATURE GRANULOCYTE %: 1 % (ref 0–1)
LYMPHOCYTE #: 1.78 10*3/uL (ref 1.00–4.80)
LYMPHOCYTE %: 23 %
MCH: 28.2 pg (ref 26.0–32.0)
MCHC: 30.3 g/dL — ABNORMAL LOW (ref 31.0–35.5)
MCV: 93.1 fL (ref 78.0–100.0)
MONOCYTE #: 0.39 10*3/uL (ref 0.20–1.10)
MONOCYTE %: 5 %
MPV: 10.2 fL (ref 8.7–12.5)
NEUTROPHIL #: 5.25 10*3/uL (ref 1.50–7.70)
NEUTROPHIL %: 68 %
PLATELETS: 233 10*3/uL (ref 150–400)
RBC: 2.91 10*6/uL — ABNORMAL LOW (ref 3.85–5.22)
RDW-CV: 14.7 % (ref 11.5–15.5)
WBC: 7.7 10*3/uL (ref 3.7–11.0)

## 2021-06-19 LAB — BASIC METABOLIC PANEL
ANION GAP: 8 mmol/L (ref 4–13)
BUN/CREA RATIO: 18 (ref 6–22)
BUN: 20 mg/dL (ref 8–25)
CALCIUM: 9.3 mg/dL (ref 8.5–10.0)
CHLORIDE: 104 mmol/L (ref 96–111)
CO2 TOTAL: 28 mmol/L (ref 22–30)
CREATININE: 1.12 mg/dL — ABNORMAL HIGH (ref 0.60–1.05)
ESTIMATED GFR: 61 mL/min/BSA (ref >=60)
GLUCOSE: 102 mg/dL (ref 65–125)
POTASSIUM: 3.6 mmol/L (ref 3.5–5.1)
SODIUM: 140 mmol/L (ref 136–145)

## 2021-06-19 LAB — C-REACTIVE PROTEIN(CRP),INFLAMMATION: CRP INFLAMMATION: 43.8 mg/L — ABNORMAL HIGH (ref <8.0)

## 2021-06-19 MED ORDER — LACTULOSE 20 GRAM/30 ML ORAL SOLUTION
30.0000 mL | Freq: Every day | ORAL | Status: DC
Start: 2021-06-20 — End: 2021-06-21
  Administered 2021-06-20: 0 mL via ORAL
  Administered 2021-06-21: 30 mL via ORAL
  Filled 2021-06-19 (×2): qty 30

## 2021-06-19 MED ORDER — LIDOCAINE (PF) 10 MG/ML (1 %) INJECTION SOLUTION
0.5000 mL | Freq: Once | INTRAMUSCULAR | Status: AC
Start: 2021-06-19 — End: 2021-06-19
  Administered 2021-06-19: 0.5 mL via INTRADERMAL

## 2021-06-19 MED ORDER — SODIUM CHLORIDE 0.9 % (FLUSH) INJECTION SYRINGE
20.0000 mL | INJECTION | INTRAMUSCULAR | Status: DC | PRN
Start: 2021-06-19 — End: 2021-06-21

## 2021-06-19 MED ORDER — SODIUM CHLORIDE 0.9 % (FLUSH) INJECTION SYRINGE
10.0000 mL | INJECTION | Freq: Three times a day (TID) | INTRAMUSCULAR | Status: DC
Start: 2021-06-19 — End: 2021-06-21
  Administered 2021-06-19 – 2021-06-20 (×5): 10 mL
  Administered 2021-06-21: 13 mL
  Administered 2021-06-21: 10 mL
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
## 2021-06-19 NOTE — Consults (Signed)
[facility]  Pain Management Consult Service  Follow-up Note      Date of Service:  06/19/2021  Date of Admission:  06/17/2021  Patient: Brenda [name]  Date of Birth:  [birth year], 46 y.o.  MRN: [id number]        Assessment/Recommendations:    -RUE pain s/p ORIF of R humeral fx with I&D      - recommend continuing current regimen.       Subjective:  Pt sleeping in bed. She reports her pain is improved with changes to regimen.       Current Medications:  acetaminophen (TYLENOL) tablet, 650 mg, Oral, Q6HRS  cefepime (MAXIPIME) 2 g in NS 100 mL IVPB minibag, 2 g, Intravenous, Q12H  D5W 250 mL flush bag, , Intravenous, Q15 Min PRN  enoxaparin PF (LOVENOX) 40 mg/0.4 mL SubQ injection, 40 mg, Subcutaneous, Q24H  ketorolac (TORADOL) 15 mg/mL injection, 15 mg, Intravenous, Q6H PRN  lidocaine-menthol (LIDOPATCH) 3.6%-1.25% patch, 1 Patch, Transdermal, Daily  methadone (METHADONE INTENSOL) 10 mg per mL concentrated oral liquid, 125 mg, Oral, Daily  methocarbamol (ROBAXIN) tablet, 750 mg, Oral, 4x/day  metroNIDAZOLE (FLAGYL) tablet, 500 mg, Oral, 2x/day  nicotine (NICODERM CQ) transdermal patch (mg/24 hr), 7 mg, Transdermal, Daily  nicotine polacrilex (NICORETTE) chewing gum, 2 mg, Oral, Q1H PRN  NS 250 mL flush bag, , Intravenous, Q15 Min PRN  NS flush syringe, 2-6 mL, Intracatheter, Q8HRS  NS flush syringe, 2-6 mL, Intracatheter, Q1 MIN PRN  ondansetron (ZOFRAN) 2 mg/mL injection, 4 mg, Intravenous, Q6H PRN  oxyCODONE concentrate (ROXICODONE INTENSOL) 20mg  per mL oral liquid, 15 mg, Oral, Q4H PRN  oxyCODONE concentrate (ROXICODONE INTENSOL) 20mg  per mL oral liquid, 20 mg, Oral, Q4H PRN  polyethylene glycol (MIRALAX) oral packet, 17 g, Oral, Daily  sennosides-docusate sodium (SENOKOT-S) 8.6-50mg  per tablet, 1 Tablet, Oral, 2x/day        Problem List:   Active Hospital Problems    Diagnosis    Primary Problem: Infection of humerus (CMS HCC)       Objective:   Vital Signs:  Filed Vitals:    06/18/21 2140  06/18/21 2220 06/19/21 0358 06/19/21 0801   BP: (!) 162/86 (!) 158/93 (!) 149/74 (!) 149/71   Pulse: 93 91 93 69   Resp: 16 16 16     Temp: 36.9 C (98.4 F) 36.6 C (97.9 F) 36.6 C (97.9 F) 36.6 C (97.9 F)   SpO2: 100% 95% 100% 98%     Body mass index is 25.83 kg/m.    Today's Physical Exam:  General: no distress  Eyes: Conjunctiva clear.  HENT:Mouth mucous membranes moist.   Neck: supple, symmetrical, trachea midline  Lungs: respirations even and unlabored  Extremities: No cyanosis or edema  Skin: Skin warm and dry  Neurologic: Alert and oriented x3  Psychiatric: Normal affect, behavior, memory, thought content, judgement, and speech.       Labs  Please indicate ordered or reviewed)  Reviewed: I have reviewed all lab results.      Diagnostic/ Radiology Tests  Reviewed: N/A            08/19/21, APRN,NP-C 06/19/2021, 08:27            The was visit conducted independently by the provider above.  The note was authored independently by the provider above.  Unless directly noted by me in the chart, I did not evaluate the patient or participate in formulating the plan of care.  Signature required by employer.  Chipper Oman, MD
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
## 2021-06-19 NOTE — Care Plan (Signed)
Problem: VTE (Venous Thromboembolism)  Goal: VTE (Venous Thromboembolism) Symptom Resolution  Outcome: Ongoing (see interventions/notes)  Intervention: Prevent or Manage VTE (Venous Thromboembolism)  Recent Flowsheet Documentation  Taken 06/18/2021 2000 by Norva Karvonen, RN  VTE Prevention/Management:   ambulation promoted   dorsiflexion/plantar flexion performed     Problem: Hypertension Comorbidity  Goal: Blood Pressure in Desired Range  Outcome: Ongoing (see interventions/notes)     Problem: Orthopaedic Fracture  Goal: Absence of Bleeding  Outcome: Ongoing (see interventions/notes)  Intervention: Monitor and Manage Fracture Bleeding  Recent Flowsheet Documentation  Taken 06/18/2021 2000 by Norva Karvonen, RN  Bleeding Management: dressing monitored  Goal: Effective Bowel Elimination  Outcome: Ongoing (see interventions/notes)  Goal: Absence of Embolism Signs and Symptoms  Outcome: Ongoing (see interventions/notes)  Intervention: Prevent or Manage Embolism Risk  Recent Flowsheet Documentation  Taken 06/18/2021 2000 by Norva Karvonen, RN  VTE Prevention/Management:   ambulation promoted   dorsiflexion/plantar flexion performed  Goal: Fracture Stability  Outcome: Ongoing (see interventions/notes)  Goal: Optimal Functional Ability  Outcome: Ongoing (see interventions/notes)  Goal: Absence of Infection Signs and Symptoms  Outcome: Ongoing (see interventions/notes)  Intervention: Prevent or Manage Infection  Recent Flowsheet Documentation  Taken 06/18/2021 2000 by Norva Karvonen, RN  Fever Reduction/Comfort Measures:   lightweight bedding   lightweight clothing  Goal: Effective Tissue Perfusion  Outcome: Ongoing (see interventions/notes)  Goal: Optimal Pain Control and Function  Outcome: Ongoing (see interventions/notes)  Goal: Effective Oxygenation and Ventilation  Outcome: Ongoing (see interventions/notes)  Intervention: Promote Airway Secretion Clearance  Recent Flowsheet Documentation  Taken 06/18/2021 2000 by  Norva Karvonen, RN  Cough And Deep Breathing: done independently per patient     Problem: Fall Injury Risk  Goal: Absence of Fall and Fall-Related Injury  Outcome: Ongoing (see interventions/notes)  Intervention: Promote Injury-Free Environment  Recent Flowsheet Documentation  Taken 06/19/2021 0200 by Norva Karvonen, RN  Safety Promotion/Fall Prevention: safety round/check completed  Taken 06/19/2021 0000 by Norva Karvonen, RN  Safety Promotion/Fall Prevention: safety round/check completed  Taken 06/18/2021 2200 by Norva Karvonen, RN  Safety Promotion/Fall Prevention: safety round/check completed  Taken 06/18/2021 2000 by Norva Karvonen, RN  Safety Promotion/Fall Prevention: safety round/check completed     Problem: Pain Acute  Goal: Optimal Pain Control and Function  Outcome: Ongoing (see interventions/notes)  Intervention: Optimize Psychosocial Wellbeing  Recent Flowsheet Documentation  Taken 06/18/2021 2000 by Norva Karvonen, RN  Diversional Activities: television     Patient admitted on 4/30 for I&D of the R humerus. Patient is alert and oriented x4. Pain managed with PRN medications. Calm and cooperative with care this shift. D/C pending antibiotic selection. Bed in lowest position, wheels locked, call light within reach.
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
## 2021-06-19 NOTE — Discharge Instructions (Signed)
PICC DISCHARGE INSTRUCTIONS      PICC Information:  4 FR Power PICC, Bard SOLO  with 1 lumen(s).  Placed in Left, Brachial Vein with tip location of SVC.   Total Catheter length is 45 cm.  External Catheter length is 3 cm.  Placed on 06/19/21  Placed by Wilnette Kales BSN, RN, VA-BC   [facility] Vascular Access Team:   Office Phone with Voicemail [phone] ext: [phone]  Pager [phone], pager [phone]     Flush Instruction:  use only a syringe; flush with a push; pause motion and saline daily    Blood Sampling Instructions  Flush 2ml saline, waste 2-5 ml, obtain specimen, flush 51ml saline    Dressing Care Instructions:  Stat Lock securement device should be changed weekly with dressing change and as needed.  Change PICC cap/valve weekly and as needed, Use sterile technique, cover PICC and the device securement with transparent dressing at least weekly and as needed. Change first PICC dressing on DATE: 06/26/21      Discharge Recommendations/ Plan:Discharge RW:ERXV with Home Health and Home Infusion      Resources: [facility] (report [id number]) [phone], [facility] [phone]         If there are any other questions you have, or if a medical problem should develop, please call [phone] ([phone]). Our transitions nurses are available to assist you Monday thru Friday from 8 am-4:30 pm. These nurses can help you with questions regarding your discharge instructions. If this is after 4:30 pm, a weekend, or holiday please call and ask to speak to the doctor on call for the Hospitalist team.  In case of an emergency call 911.

## 2021-06-19 NOTE — Care Plan (Signed)
Goal: Activity - ambulate OOB, PT/OT, ROM  Goal: Safety - OOB with FWW, fall prevention education, call bell within reach  Goal: Infection control - IV abx therapy, hand hygiene, patient education  Goal: Pain control - Reposition, PRN Oxycodone/Toradol  Goal: Skin integrity - encourage frequent weight shift, nutritional intake  Goal: DVT prevention - Ambulation, SCD's, TED hose, Lovenox  Goal: D/C - pending abx      Patient admitted for right humerus I&D. Patient alert and oriented. VSS. +CMS in all extremities.  Dressing to right humerus cdi. Patient pain being managed with PRN Oxycodone. Patient ambulates OOB ad lib. Patient tolerating diet well with no c/o nausea. Patient is tolerating IV ABX well. PT/OT recommending home. Patient educated on policies. POC reviewed. Call bell within reach. D/C pending abx.      Problem: Fall Injury Risk  Goal: Absence of Fall and Fall-Related Injury  Intervention: Identify and Manage Contributors  Flowsheets (Taken 06/19/2021 1611)  Self-Care Promotion:   independence encouraged   BADL personal objects within reach  Medication Review/Management: medications reviewed
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
## 2021-06-20 DIAGNOSIS — E038 Other specified hypothyroidism: Secondary | ICD-10-CM

## 2021-06-20 DIAGNOSIS — F329 Major depressive disorder, single episode, unspecified: Secondary | ICD-10-CM

## 2021-06-20 LAB — CBC WITH DIFF
BASOPHIL #: <0.1 10*3/uL (ref <=0.20)
BASOPHIL %: 1 %
EOSINOPHIL #: 0.24 10*3/uL (ref <=0.50)
EOSINOPHIL %: 3 %
HCT: 25.7 % — ABNORMAL LOW (ref 34.8–46.0)
HGB: 8 g/dL — ABNORMAL LOW (ref 11.5–16.0)
IMMATURE GRANULOCYTE #: <0.1 10*3/uL (ref <0.10)
IMMATURE GRANULOCYTE %: 1 % (ref 0–1)
LYMPHOCYTE #: 2.29 10*3/uL (ref 1.00–4.80)
LYMPHOCYTE %: 29 %
MCH: 28.9 pg (ref 26.0–32.0)
MCHC: 31.1 g/dL (ref 31.0–35.5)
MCV: 92.8 fL (ref 78.0–100.0)
MONOCYTE #: 0.44 10*3/uL (ref 0.20–1.10)
MONOCYTE %: 6 %
MPV: 10.4 fL (ref 8.7–12.5)
NEUTROPHIL #: 4.81 10*3/uL (ref 1.50–7.70)
NEUTROPHIL %: 60 %
PLATELETS: 240 10*3/uL (ref 150–400)
RBC: 2.77 10*6/uL — ABNORMAL LOW (ref 3.85–5.22)
RDW-CV: 14.7 % (ref 11.5–15.5)
WBC: 7.9 10*3/uL (ref 3.7–11.0)

## 2021-06-20 LAB — HEPATIC FUNCTION PANEL
ALBUMIN: 2.5 g/dL — ABNORMAL LOW (ref 3.5–5.0)
ALKALINE PHOSPHATASE: 84 U/L (ref 40–110)
ALT (SGPT): 13 U/L (ref 8–22)
AST (SGOT): 18 U/L (ref 8–45)
BILIRUBIN DIRECT: 0.1 mg/dL (ref 0.1–0.4)
BILIRUBIN TOTAL: 0.2 mg/dL — ABNORMAL LOW (ref 0.3–1.3)
PROTEIN TOTAL: 6.6 g/dL (ref 6.4–8.3)

## 2021-06-20 LAB — BASIC METABOLIC PANEL
ANION GAP: 7 mmol/L (ref 4–13)
BUN/CREA RATIO: 23 — ABNORMAL HIGH (ref 6–22)
BUN: 26 mg/dL — ABNORMAL HIGH (ref 8–25)
CALCIUM: 9.3 mg/dL (ref 8.5–10.0)
CHLORIDE: 105 mmol/L (ref 96–111)
CO2 TOTAL: 29 mmol/L (ref 22–30)
CREATININE: 1.11 mg/dL — ABNORMAL HIGH (ref 0.60–1.05)
ESTIMATED GFR: 62 mL/min/BSA (ref >=60)
GLUCOSE: 102 mg/dL (ref 65–125)
POTASSIUM: 3.8 mmol/L (ref 3.5–5.1)
SODIUM: 141 mmol/L (ref 136–145)

## 2021-06-20 LAB — C-REACTIVE PROTEIN(CRP),INFLAMMATION: CRP INFLAMMATION: 26.8 mg/L — ABNORMAL HIGH (ref <8.0)

## 2021-06-20 MED ORDER — BISACODYL 10 MG RECTAL SUPPOSITORY
10.0000 mg | Freq: Once | RECTAL | Status: DC | PRN
Start: 2021-06-20 — End: 2021-06-21

## 2021-06-20 MED ORDER — POLYETHYLENE GLYCOL 3350 17 GRAM ORAL POWDER PACKET
17.0000 g | Freq: Two times a day (BID) | ORAL | Status: DC
Start: 2021-06-20 — End: 2021-06-21
  Administered 2021-06-20 – 2021-06-21 (×2): 17 g via ORAL
  Filled 2021-06-20 (×2): qty 1

## 2021-06-20 NOTE — Consults (Signed)
Outpatient Parenteral Antimicrobial Therapy Program      Patient Name: Brenda [name]  MRN: [id number]  Patient Address: [address] [place] [address]  DOB: [birth year]  Date: 06/20/21    Outpatient Antimicrobial Treatment Plan     Diagnosis Group:  Diagnosis:   Microorganisms:   Bone and joint infection Right humeral fracture  Pseudomonas (wound)  E coli (urine)       Antibiotics  IV Antibiotics:  Dose  Frequency Anticipated Stop date:  Comments:   Cefepime 2gm Every 12 hours  07/29/21    Oral Antibiotics:  Dose Frequency Anticipated Stop date:  Comment:   Metrondazole  500mg  Twice a day 07/29/21    6 weeks therapy     Important drug interactions:      Allergies:   Allergies   Allergen Reactions   . No Known Drug Allergies             Labs monitoring plan/orders  Labs and Frequency Additional Comments   CBC/diff, BUN, Creatinine, Hepatic function panel and Inflammatory CRP Every Monday       OPAT Pharmacist:  Dr. Sunday  Phone Number: [phone] Ext: [phone]  Fax Labs To: [phone]     Physician Monitoring OPAT Course:   Dr. [phone]  Phone Number: [phone]  Fax Labs To: [phone]       ID follow-up:    ID clinic 2-3 weeks after discharge          Attending Physician    Bhushan       If there are any changes to antibiotic plan or new culture results after the OPAT note has been placed, please contact [phone] so we can update the OPAT note.      [place], PharmD, BCIDP  Infectious Diseases Clinical Pharmacist  [phone]
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
## 2021-06-20 NOTE — Care Plan (Signed)
[facility]  Rehabilitation Services  Physical Therapy Progress Note      Patient Name: Brenda [name]  Date of Birth: 03-10-1975  Height:  162.6 cm (5' 4.02")  Weight:  68.3 kg (150 lb 9.2 oz)  Room/Bed: 11/A  Payor: [employer]ICAID / Plan: [employer] MEDICAID / Product Type: Medicaid [facility] /     Assessment:     Pt tolerated mobility assessment well. Completes all transfers and ambulation with SBA, no LOB noted. Demos no further need for acute care PT, please reconsult as needed. Rec d/c to home with assist once medically appropriate.    Discharge Needs:   Equipment Recommendation: none anticipated  Discharge Disposition: home with assist    JUSTIFICATION OF DISCHARGE RECOMMENDATION   Based on current diagnosis, functional performance prior to admission, and current functional performance, this patient requires continued PT services in home with assist in order to achieve significant functional improvements in these deficit areas: gait, locomotion, and balance, muscle performance.      Plan:   Continue to follow patient according to established plan of care.  The risks/benefits of therapy have been discussed with the patient/caregiver and he/she is in agreement with the established plan of care.     Subjective & Objective:        06/20/21 1054   Therapist Pager   PT Assigned/ Pager # [phone]   Rehab Session   Document Type therapy progress note (daily note)   Total PT Minutes: 8   Patient Effort good   Symptoms Noted During/After Treatment none   General Information   Patient Profile Reviewed yes   Medical Lines PIV Line   Respiratory Status room air   Existing Precautions/Restrictions fall precautions;full code;weight bearing restriction   Weight-bearing Status   Left Upper Extremity non weight-bearing (NWB)   Right Upper Extremity non weight-bearing (NWB)   Mutuality/Individual Preferences   Individualized Care Needs OOB ad lib per RN   Plan of Care Reviewed With patient   Pre Treatment Status    Pre Treatment Patient Status Patient sitting on edge of bed;Call light within reach;Telephone within reach;Nurse approved session   Support Present Pre Treatment  None   Communication Pre Treatment  Nurse   Cognitive Assessment/Interventions   Behavior/Mood Observations behavior appropriate to situation, WNL/WFL   Orientation Status oriented x 4   Attention WNL/WFL   Follows Commands WNL   Vital Signs   O2 Delivery Pre Treatment room air   O2 Delivery Post Treatment room air   Vitals Comment no s/s of distress   Pain Assessment   Pre/Posttreatment Pain Comment no c/o pain   Transfer Assessment/Treatment   Sit-Stand Independence supervision required   Stand-Sit Independence supervision required   Gait Assessment/Treatment   Total Distance Ambulated 200   Independence  stand-by assistance   Balance Skill Training   Comment unsupported   Sitting Balance: Static good balance   Sitting, Dynamic (Balance) good balance   Sit-to-Stand Balance good balance   Standing Balance: Static good balance   Standing Balance: Dynamic good balance   Post Treatment Status   Post Treatment Patient Status Patient sitting on edge of bed;Call light within reach;Telephone within reach   Support Present Post Treatment  None   Plan of Care Review   Plan Of Care Reviewed With patient   Basic Mobility Am-PAC/6Clicks Score (APPROVED Staff)   Turning in bed without bedrails 4   Lying on back to sitting on edge of flat bed  4   Moving to and from a bed to a chair 4   Standing up from chair 4   Walk in room 4   Climbing 3-5 steps with railing 4   6 Clicks Raw Score total 24   Standardized (t-scale) score 57.68   CMS 0-100% Score 0   CMS Modifier CH   Patient Mobility Goal (JHHLM) 7- Walk 25 feet or more 3X/day   Exercise/Activity Level Performed 7- Walked 25 feet or more   Physical Therapy Clinical Impression   Assessment Pt tolerated mobility assessment well. Completes all transfers and ambulation with SBA, no LOB noted. Demos no further need for  acute care PT, please reconsult as needed. Rec d/c to home with assist once medically appropriate.   Patient/Family Goals Statement to get better   Criteria for Skilled Therapeutic no problems identified which require skilled intervention   Anticipated Equipment Needs at Discharge (PT) none anticipated   Anticipated Discharge Disposition home with assist         Therapist:   Linford Arnold, PT   Pager #: [phone]
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
## 2021-06-20 NOTE — Care Plan (Signed)
Goal: Activity - ambulate OOB, PT/OT, ROM  Goal: Safety - OOB with FWW, fall prevention education, call bell within reach  Goal: Infection control - IV abx therapy, hand hygiene, patient education  Goal: Pain control - Reposition, PRN Oxycodone/Toradol  Goal: Skin integrity - encourage frequent weight shift, nutritional intake  Goal: DVT prevention - Ambulation, SCD's, TED hose, Lovenox  Goal: D/C - pending abx      Patient admitted for right humerus I&D. Patient alert and oriented. VSS. +CMS in all extremities.  Dressing to right humerus cdi. Patient pain being managed with PRN Oxycodone. Patient ambulates OOB ad lib. Patient tolerating diet well with no c/o nausea. Patient is tolerating IV ABX well. PT/OT recommending home. Patient educated on policies. POC reviewed. Call bell within reach. D/C pending abx.      Problem: Orthopaedic Fracture  Goal: Optimal Functional Ability  Intervention: Optimize Functional Ability  Recent Flowsheet Documentation  Taken 06/20/2021 1704 by Cristina Gong, RN  Self-Care Promotion:   independence encouraged   BADL personal objects within reach

## 2021-06-21 ENCOUNTER — Other Ambulatory Visit: Payer: Self-pay

## 2021-06-21 ENCOUNTER — Non-Acute Institutional Stay: Payer: MEDICAID

## 2021-06-21 LAB — HEPATIC FUNCTION PANEL
ALBUMIN: 3 g/dL — ABNORMAL LOW (ref 3.5–5.0)
ALKALINE PHOSPHATASE: 101 U/L (ref 40–110)
ALT (SGPT): 16 U/L (ref 8–22)
AST (SGOT): 20 U/L (ref 8–45)
BILIRUBIN DIRECT: 0.1 mg/dL (ref 0.1–0.4)
BILIRUBIN TOTAL: 0.2 mg/dL — ABNORMAL LOW (ref 0.3–1.3)
PROTEIN TOTAL: 7.5 g/dL (ref 6.4–8.3)

## 2021-06-21 LAB — CBC WITH DIFF
BASOPHIL #: <0.1 10*3/uL (ref <=0.20)
BASOPHIL %: 1 %
EOSINOPHIL #: 0.36 10*3/uL (ref <=0.50)
EOSINOPHIL %: 4 %
HCT: 29.5 % — ABNORMAL LOW (ref 34.8–46.0)
HGB: 9.2 g/dL — ABNORMAL LOW (ref 11.5–16.0)
IMMATURE GRANULOCYTE #: 0.1 10*3/uL — ABNORMAL HIGH (ref <0.10)
IMMATURE GRANULOCYTE %: 1 % (ref 0–1)
LYMPHOCYTE #: 2.97 10*3/uL (ref 1.00–4.80)
LYMPHOCYTE %: 29 %
MCH: 28.8 pg (ref 26.0–32.0)
MCHC: 31.2 g/dL (ref 31.0–35.5)
MCV: 92.2 fL (ref 78.0–100.0)
MONOCYTE #: 0.46 10*3/uL (ref 0.20–1.10)
MONOCYTE %: 5 %
MPV: 10.2 fL (ref 8.7–12.5)
NEUTROPHIL #: 6.24 10*3/uL (ref 1.50–7.70)
NEUTROPHIL %: 60 %
PLATELETS: 319 10*3/uL (ref 150–400)
RBC: 3.2 10*6/uL — ABNORMAL LOW (ref 3.85–5.22)
RDW-CV: 14.8 % (ref 11.5–15.5)
WBC: 10.2 10*3/uL (ref 3.7–11.0)

## 2021-06-21 LAB — BASIC METABOLIC PANEL
ANION GAP: 7 mmol/L (ref 4–13)
BUN/CREA RATIO: 29 — ABNORMAL HIGH (ref 6–22)
BUN: 28 mg/dL — ABNORMAL HIGH (ref 8–25)
CALCIUM: 9.7 mg/dL (ref 8.5–10.0)
CHLORIDE: 102 mmol/L (ref 96–111)
CO2 TOTAL: 28 mmol/L (ref 22–30)
CREATININE: 0.97 mg/dL (ref 0.60–1.05)
ESTIMATED GFR: 73 mL/min/BSA (ref >=60)
GLUCOSE: 83 mg/dL (ref 65–125)
POTASSIUM: 4.3 mmol/L (ref 3.5–5.1)
SODIUM: 137 mmol/L (ref 136–145)

## 2021-06-21 LAB — MAGNESIUM: MAGNESIUM: 2.1 mg/dL (ref 1.8–2.6)

## 2021-06-21 MED ORDER — NALOXONE 4 MG/ACTUATION NASAL SPRAY
1.0000 | NASAL | 0 refills | Status: AC | PRN
Start: 2021-06-21 — End: ?
  Filled 2021-06-21: qty 2, 1d supply, fill #0

## 2021-06-21 MED ORDER — METHOCARBAMOL 750 MG TABLET
750.0000 mg | ORAL_TABLET | Freq: Four times a day (QID) | ORAL | 0 refills | Status: AC
Start: 2021-06-21 — End: ?
  Filled 2021-06-21: qty 28, 7d supply, fill #0

## 2021-06-21 MED ORDER — OXYCODONE 20 MG/ML ORAL CONCENTRATE
20.0000 mg | Freq: Four times a day (QID) | ORAL | Status: DC | PRN
Start: 2021-06-21 — End: 2021-06-21

## 2021-06-21 MED ORDER — SODIUM CHLORIDE 0.9 % INTRAVENOUS PIGGYBACK
2.0000 g | Freq: Two times a day (BID) | INTRAVENOUS | Status: AC
Start: 2021-06-21 — End: 2021-07-29

## 2021-06-21 MED ORDER — OXYCODONE 20 MG/ML ORAL CONCENTRATE
10.0000 mg | Freq: Four times a day (QID) | ORAL | Status: DC | PRN
Start: 2021-06-21 — End: 2021-06-21
  Administered 2021-06-21: 10 mg via ORAL
  Filled 2021-06-21: qty 0.5

## 2021-06-21 MED ORDER — MORPHINE CONCENTRATE 100 MG/5 ML (20 MG/ML) ORAL SOLUTION
10.0000 mg | Freq: Four times a day (QID) | ORAL | 0 refills | Status: DC | PRN
Start: 2021-06-21 — End: 2021-07-02
  Filled 2021-06-21: qty 4, 2d supply, fill #0

## 2021-06-21 MED ORDER — SENNOSIDES 8.6 MG-DOCUSATE SODIUM 50 MG TABLET
1.0000 | ORAL_TABLET | Freq: Two times a day (BID) | ORAL | Status: AC
Start: 2021-06-21 — End: ?

## 2021-06-21 MED ORDER — OXYCODONE 20 MG/ML ORAL CONCENTRATE
15.0000 mg | Freq: Four times a day (QID) | ORAL | Status: DC | PRN
Start: 2021-06-21 — End: 2021-06-21

## 2021-06-21 MED ORDER — POLYETHYLENE GLYCOL 3350 17 GRAM ORAL POWDER PACKET
17.0000 g | Freq: Every day | ORAL | Status: AC
Start: 2021-06-21 — End: ?

## 2021-06-21 MED ORDER — METRONIDAZOLE 500 MG TABLET
500.0000 mg | ORAL_TABLET | Freq: Two times a day (BID) | ORAL | 0 refills | Status: AC
Start: 2021-06-21 — End: 2021-07-29
  Filled 2021-06-21: qty 76, 38d supply, fill #0

## 2021-06-21 MED ORDER — ACETAMINOPHEN 325 MG TABLET
650.0000 mg | ORAL_TABLET | Freq: Four times a day (QID) | ORAL | Status: AC | PRN
Start: 2021-06-21 — End: ?
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
## 2021-06-21 NOTE — Consults (Signed)
[facility]  Service: Ortho Trauma  Attending: Starr Sinclair  Progress Note  06/21/2021    Name: Brenda [name]  DOB: 10-Apr-1975  MRN: [id number]    [place] Date:06/17/2021  Date: 06/21/2021    SUBJECTIVE:  46 y.o. female resting in bed. No acute events overnight. Pain is well controlled. No new complaints.     OBJECTIVE:  Vitals: BP 132/66   Pulse 82   Temp 36.6 C (97.9 F)   Resp 16   Ht 1.626 m (5' 4.02")   Wt 68.3 kg (150 lb 9.2 oz)   SpO2 97%   BMI 25.83 kg/m     NAD, resting comfortably in bed  Breathing non-labored    RUE:  Incisions c/d/i.  +thumbs up, okay sign, cross IF/MF, flex/extend wrist  SILT (M/U/R) right hand  + radial pulse      PERTINENT LABS:   CBC:   Recent Labs     06/19/21  0530 06/20/21  0550 06/21/21  0442   WBC 7.7 7.9 10.2   HGB 8.2* 8.0* 9.2*   HCT 27.1* 25.7* 29.5*   PLTCNT 233 240 319       ASSESSMENT:  46 y.o. female s/p I&D, ORIF right humerus fx by outside provider.     PLAN:  - No operative intervention planned by orthopaedics.   - PICC line placed yesterday  - Weightbearing: NWB RUE  - PT/OT: ordered; recs home with assist   - Antibiotics: On cefepime, flagyl   - Pain: per primary  - Drain: JP pulled 5/2  - Dressing: located RUE, okay for nursing to reinforce prn  - Diet: no restrictions from ortho  - Dispo: pending abx decision and pt progress  - Follow-up: will schedule at follow up    Jyl Heinz, MD  Orthopaedic Surgery, PGY-3  [facility]   Pager [phone]
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
## 2021-06-21 NOTE — Care Plan (Signed)
[facility]  Rehabilitation Services  Occupational Therapy Progress Note    Patient Name: Brenda [name]  Date of Birth: [birth year]  Height:  162.6 cm (5' 4.02")  Weight:  68.3 kg (150 lb 9.2 oz)  Room/Bed: 11/A  Payor: HEALTH PLAN MEDICAID / Plan: HEALTH PLAN MEDICAID / Product Type: Medicaid MC /     Assessment:    Pt demonstrated improved pain control and ADL performance. Pt states she is feeling much better today and tolerating increased activity. Recommend home with assist.      Discharge Needs:   Equipment Recommendation: none anticipated    Discharge Disposition:  home with assist     Plan:   Continue to follow patient according to established plan of care.  The risks/benefits of therapy have been discussed with the patient/caregiver and he/she is in agreement with the established plan of care.     Subjective & Objective:      06/20/21 1055   Rehab Session   Document Type therapy progress note (daily note)   Total OT Minutes: 8   Patient Effort good   Symptoms Noted During/After Treatment none   General Information   Medical Lines PIV Line   Respiratory Status room air   Existing Precautions/Restrictions fall precautions;full code;weight bearing restriction   Pre Treatment Status   Pre Treatment Patient Status Patient sitting on edge of bed;Telephone within reach;Call light within reach   Support Present Pre Treatment  None   Communication Pre Treatment  Nurse   Mutuality/Individual Preferences   Individualized Care Needs has been up ad lib   Self-Care   Dominant Hand right   Current Activity Tolerance good   Pain Assessment   Pre/Posttreatment Pain Comment reports L UE pain is better controlled currently; did not rate   Coping/Psychosocial Response Interventions   Plan Of Care Reviewed With patient   Cognitive Assessment/Interventions   Behavior/Mood Observations alert;cooperative   Orientation Status oriented x 4   Attention WNL/WFL   Follows Commands WNL   Mobility Assessment/Training    Mobility Comment pt ambulated 200 ft with SB/supervision   Weight-bearing Status   Left Upper Extremity non weight-bearing (NWB)   Right Upper Extremity non weight-bearing (NWB)   Transfer Assessment/Treatment   Sit-Stand Independence supervision required   Stand-Sit Independence supervision required   Lower Body Dressing Assessment/Training   Comment donned B socks with supervision while sitting EOB   Self-Feeding Assessment/Training   Comment feeding self without difficulty upon OT's arrival   Balance Skill Training   Sitting Balance: Static good balance   Sitting, Dynamic (Balance) good balance   Sit-to-Stand Balance good balance   Standing Balance: Static good balance   Standing Balance: Dynamic good balance   Post Treatment Status   Post Treatment Patient Status Patient sitting on edge of bed;Call light within reach;Telephone within reach   Support Present Post Treatment  None   Clinical Impression   Functional Level at Time of Session Pt demonstrated improved pain control and ADL performance. Pt states she is feeling much better today and tolerating increased activity. Recommend home with assist.         Therapist:   Diona Foley, OT  Pager #: [phone]

## 2021-06-21 NOTE — Care Plan (Signed)
Problem: VTE (Venous Thromboembolism)  Goal: VTE (Venous Thromboembolism) Symptom Resolution  Outcome: Ongoing (see interventions/notes)  Intervention: Prevent or Manage VTE (Venous Thromboembolism)  Recent Flowsheet Documentation  Taken 06/20/2021 2000 by Norva Karvonen, RN  VTE Prevention/Management:   ambulation promoted   dorsiflexion/plantar flexion performed     Problem: Hypertension Comorbidity  Goal: Blood Pressure in Desired Range  Outcome: Ongoing (see interventions/notes)     Problem: Orthopaedic Fracture  Goal: Absence of Bleeding  Outcome: Ongoing (see interventions/notes)  Intervention: Monitor and Manage Fracture Bleeding  Recent Flowsheet Documentation  Taken 06/20/2021 2000 by Norva Karvonen, RN  Bleeding Management: dressing monitored  Goal: Effective Bowel Elimination  Outcome: Ongoing (see interventions/notes)  Goal: Absence of Embolism Signs and Symptoms  Outcome: Ongoing (see interventions/notes)  Intervention: Prevent or Manage Embolism Risk  Recent Flowsheet Documentation  Taken 06/20/2021 2000 by Norva Karvonen, RN  VTE Prevention/Management:   ambulation promoted   dorsiflexion/plantar flexion performed  Goal: Fracture Stability  Outcome: Ongoing (see interventions/notes)  Goal: Optimal Functional Ability  Outcome: Ongoing (see interventions/notes)  Goal: Absence of Infection Signs and Symptoms  Outcome: Ongoing (see interventions/notes)  Intervention: Prevent or Manage Infection  Recent Flowsheet Documentation  Taken 06/20/2021 2000 by Norva Karvonen, RN  Fever Reduction/Comfort Measures:   lightweight bedding   lightweight clothing  Goal: Effective Tissue Perfusion  Outcome: Ongoing (see interventions/notes)  Goal: Optimal Pain Control and Function  Outcome: Ongoing (see interventions/notes)  Intervention: Manage Acute Orthopaedic-Related Pain  Recent Flowsheet Documentation  Taken 06/20/2021 2000 by Norva Karvonen, RN  Sleep/Rest Enhancement: awakenings minimized  Goal: Effective  Oxygenation and Ventilation  Outcome: Ongoing (see interventions/notes)  Intervention: Promote Airway Secretion Clearance  Recent Flowsheet Documentation  Taken 06/20/2021 2000 by Norva Karvonen, RN  Cough And Deep Breathing: done independently per patient     Problem: Fall Injury Risk  Goal: Absence of Fall and Fall-Related Injury  Outcome: Ongoing (see interventions/notes)  Intervention: Promote Injury-Free Environment  Recent Flowsheet Documentation  Taken 06/21/2021 0200 by Norva Karvonen, RN  Safety Promotion/Fall Prevention: safety round/check completed  Taken 06/21/2021 0000 by Norva Karvonen, RN  Safety Promotion/Fall Prevention: safety round/check completed  Taken 06/20/2021 2200 by Norva Karvonen, RN  Safety Promotion/Fall Prevention: safety round/check completed  Taken 06/20/2021 2000 by Norva Karvonen, RN  Safety Promotion/Fall Prevention: safety round/check completed     Problem: Pain Acute  Goal: Optimal Pain Control and Function  Outcome: Ongoing (see interventions/notes)  Intervention: Prevent or Manage Pain  Recent Flowsheet Documentation  Taken 06/20/2021 2000 by Norva Karvonen, RN  Sleep/Rest Enhancement: awakenings minimized  Intervention: Optimize Psychosocial Wellbeing  Recent Flowsheet Documentation  Taken 06/20/2021 2000 by Norva Karvonen, RN  Diversional Activities:   television   smartphone     Problem: Surgical Site Infection  Goal: Absence of Infection Signs and Symptoms  Outcome: Ongoing (see interventions/notes)  Intervention: Prevent or Manage Infection  Recent Flowsheet Documentation  Taken 06/20/2021 2000 by Norva Karvonen, RN  Fever Reduction/Comfort Measures:   lightweight bedding   lightweight clothing     Patient admitted on 4/30 for a R humerus I&D. Patient is nonweightbearing to the RUE. Plan is to have 6 weeks of IV antibiotics. PT/OT is recommending home pending selection of antibiotics. Pain managed with PRN medications. Calm and cooperative with care this shift. Bed  in lowest position, wheels locked, call light within reach.

## 2021-06-21 NOTE — Transitional Care (Signed)
[facility] Medicine  Transitional Care Coordination      Initial Assessment  Name: Brenda [name]  Address: [address]  [place] [address]   [address] [place] [address]  Date of Birth: 17-Aug-1975 46 y.o.  Date of Service: 06/21/2021  Lay Caregiver: Billey Co [phone]      1.  Obtained verbal consent to contact and schedule a PCP follow up in 7-14 days per PCP request.   2.  PCP listed as Beecher Mcardle, MD. This is correct per patient.   Last visit: past year. Patient reports she is more established with Bunnie Philips DO, chart updated  3.  Patient appointment preferences: anytime  4.  Transportation to healthcare appointments: Drives - no concerns noted  5.  Patient MyChart status: pending  6. Preferred method of contact: phone  7.  Confirmed patient contact information: yes    Home Phone [phone]   Work Phone [phone]   Mobile [phone]     8.  Obtained consent to speak with  lay caregiver: verbal from pt given  9.  Confirmed patient address listed above:  Yes, phy added, patient states when she leaves she is going to Moms house for a few  10.  Confirmed Pharmacy:  [facility] in [facility]  Preferred Pharmacy     [facility] [phone] [place], [place] - [phone] AT [facility] OF [facility] - Closed [address]    [address] [place] [address]    Phone: [phone] Fax: [phone]    Hours: Not open 24 hours    [facility]    [address] [place] [address]    Phone: [phone] Fax: [phone]    Hours: 24/7            Spoke with patient    to discuss provider of primary care services.  Discussed role of Transitional Care Coordination Team and informed of contact within 2 business days of discharge to assess follow-up plan.    Charolette Forward, LPN  07/26/5914, 38:46

## 2021-06-21 NOTE — Care Management Notes (Signed)
[facility]  Care Management Note    Patient Name: Brenda [name]  Date of Birth: 1976-01-25  Sex: female  Date/Time of Admission: 06/17/2021  9:07 PM  Room/Bed: 11/A  Payor: HEALTH PLAN MEDICAID / Plan: HEALTH PLAN MEDICAID / Product Type: Medicaid MC /    LOS: 4 days   Primary Care Providers:  Beecher Mcardle, MD, MD (General)    Admitting Diagnosis:  Infection of humerus (CMS [facility]) [M86.9]    Assessment:      06/21/21 1321   Assessment Details   Assessment Type Continued Assessment   Date of Care Management Update 06/21/21   Date of Next DCP Update 06/22/21   Insurance Information/Type   Insurance type Medicaid   Care Management Plan   Discharge Planning Status plan in progress   Projected Discharge Date 06/21/21   Discharge plan discussed with: Patient   CM will evaluate for rehabilitation potential yes   Patient choice offered to patient/family yes   Form for patient choice reviewed/signed and on chart yes   Facility or Agency Preferences [facility], [facility]   Discharge Needs Assessment   Discharge Facility/Level of Care Needs Home with Home Health and Home Infusion   Transportation Available car;family or friend will provide     Patient for discharge home today with home health and home infusion. [facility] to completed a teach and delivery of meds/supplies around 6:00 pm, per [facility]. [facility] notified of discharge. Transport via mother. Tim, RN updated to plans. AVS updated.     Discharge Plan:  Home with Home Health and Home Infusion      The patient will continue to be evaluated for developing discharge needs.     Case Manager: Heber Carolina, LGSW  Phone: [phone]

## 2021-06-21 NOTE — Transitional Care (Signed)
[facility]      Transition of Care Coordination      PCP Verification      ?          Name: Brenda [name]      Date of Service: 06/21/2021           1.Contacted Carolin Coy, NP office to verify establishment and determine barriers to follow-up. Carolin Coy, NP verified as patient's PCP. Last visit: past year.      2. Does patient have an upcoming appointment scheduled in the next 30 days?: no     3. Appointment availability within 7-14 days of discharge: yes     4. Hospital discharge follow up scheduled :  07/04/21  Appointment added to AVS or will merge with Discharge if [facility] provider noted    5. PCP agreeable to follow warfarin/INR if applicable: No     6. PCP agreeable to follow home health if applicable: Yes     7. PCP office offers designated personnel for care coordination: No     8. Current PCP office information:      Carolin Coy, NP     Phone: [phone]     Fax: [phone]        Is office information correct? Yes          Odis Hollingshead, LPN  [id number], [id number]
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
## 2021-06-22 ENCOUNTER — Non-Acute Institutional Stay: Payer: MEDICAID

## 2021-06-22 MED ORDER — IBUPROFEN 200 MG TABLET
200.0000 mg | ORAL_TABLET | Freq: Four times a day (QID) | ORAL | Status: AC | PRN
Start: 2021-06-22 — End: ?

## 2021-06-22 NOTE — Care Management Notes (Signed)
Referral Information  ++++++ Placed Provider #1 ++++++  Case Manager: Jamie Minucci  Provider Type: Home Health  Provider Name: [facility]- [place], [place], [place], [place], [place], [place], [place] and [place]  Address:  [address]  [place], [place] [address]  Contact: peggy carr    Phone: [phone] x  Fax:   Fax: [phone]

## 2021-06-22 NOTE — Care Management Notes (Signed)
Referral Information  ++++++ Placed Provider #1 ++++++  Case Manager: Jamie Minucci  Provider Type: Home Infusion  Provider Name: [facility], [facility] - [place], [place]  Address:  [address], [address]  [place], [place] [id number]  Contact:    Fax:   Fax:

## 2021-06-24 ENCOUNTER — Other Ambulatory Visit: Payer: Self-pay
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
## 2021-06-25 ENCOUNTER — Other Ambulatory Visit: Payer: MEDICAID

## 2021-06-25 ENCOUNTER — Other Ambulatory Visit: Payer: Self-pay

## 2021-06-25 ENCOUNTER — Other Ambulatory Visit (INDEPENDENT_AMBULATORY_CARE_PROVIDER_SITE_OTHER): Payer: Self-pay | Admitting: Student in an Organized Health Care Education/Training Program

## 2021-06-25 DIAGNOSIS — M009 Pyogenic arthritis, unspecified: Secondary | ICD-10-CM

## 2021-06-25 NOTE — Progress Notes (Signed)
[facility] nurse reached out to dr Laural Benes said line not drawing but infusing.  I called [facility] [phone] and spoke to nurse manager jennifer and she is aware and said to send order to [facility] and they will get it scheduled for cathflo.  I faxed orderd to [facility] [phone] and spoke to stacey she will ship out tonight and nurse can administer wed.  Pt insurance covers cath flo in home.  Velda Shell  LPN  OPAT Nurse   [phone]

## 2021-06-26 ENCOUNTER — Other Ambulatory Visit: Payer: Self-pay

## 2021-06-26 NOTE — Home Health (Incomplete)
SN for osteomylitis. Upon arrival at home patients mother let this nurse in home. Home noted to be clean and tidy as well as patient. Vitals signs within normal limits. Lungs CTA but decreased. bowel sounds positive x4 quads. PICC line flushes easily and without difficulty but unable to draw blood. Some tenderness with flushing. PICC line noted to be farely new. Area is free from redness and swelling. Educated patient to keep flushing w ith antibiotic as ordered and keep a visual on area and if pain or redness appears then go to ER to be evulated. Patient stated understanding. Cotnacted pateints surgical team to keep them updated on PICC line status. They stated they would contact patient and would discuss options with her. Wound is free from redness and drainage. Patient is aware of financial responsibilty.

## 2021-06-27 ENCOUNTER — Other Ambulatory Visit: Payer: Self-pay

## 2021-06-28 ENCOUNTER — Other Ambulatory Visit: Payer: MEDICAID

## 2021-06-28 ENCOUNTER — Other Ambulatory Visit: Payer: Self-pay

## 2021-06-28 ENCOUNTER — Encounter (INDEPENDENT_AMBULATORY_CARE_PROVIDER_SITE_OTHER): Payer: Self-pay | Admitting: Student in an Organized Health Care Education/Training Program

## 2021-06-28 LAB — ENTER/EDIT OPAT LABS
ALKALINE PHOSPHATASE: 98
ALT (SGPT): 18
AST (SGOT): 22
BASOPHILS: 0.4
BILIRUBIN, TOTAL: 0.4
BUN: 17
C-REACTIVE PROTEIN HIGH SENSITIVITY (INFLAMMATION): 13.1 mg/l — AB (ref 0.0–10)
CREATININE: 0.6
EOSINOPHIL: 1.7
HCT: 29.7
HGB: 9.1
LYMPHOCYTES: 21.7
MONOCYTES: 3.7
PLATELET COUNT: 285
PMN ABS: 7.28
PMN'S: 72.2
WBC: 10.1

## 2021-06-28 NOTE — Progress Notes (Signed)
Outpatient Parenteral Antimicrobial Therapy (OPAT) Note    Indication: R humeral fracture  Antibiotic Regimen: Cefepime 2g every 12 hours, metronidazole 500mg  PO BID   Duration: 6 weeks  End date: 07/29/21    Antibiotic Discharge Reconciliation Note     Discharge Date from [facility]: 06/21/21  Discharged to: Home       Home Infusion: [facility]   Home Health: 08/21/21 [facility]    OPAT note originally written with an end date of 06/28/21 - this was a typo that was subsequently addended. The correct end date 07/29/21, 6 weeks for a bone/joint infection.     [facility] aware of correct end date. Working on prior authorization at this time.     09/28/21, PharmD, BCIDP  Infectious Diseases Clinical Pharmacist  Ext. [phone]

## 2021-06-28 NOTE — Result Encounter Note (Signed)
OPAT Lab Monitoring Note    I have reviewed the patient's OPAT labs.   They are stable when compared to previous values.  We will continue with the current course of treatment.       Keishawna Carranza E. Amberlea Spagnuolo MD  Section of Infectious Diseases  06/28/2021

## 2021-06-29 ENCOUNTER — Other Ambulatory Visit: Payer: MEDICAID

## 2021-06-29 ENCOUNTER — Other Ambulatory Visit: Payer: Self-pay

## 2021-06-29 MED ORDER — HEPARIN LOCK FLUSH (PORCINE) 10 UNIT/ML INTRAVENOUS SOLUTION
5.0000 mL | Freq: Two times a day (BID) | INTRAVENOUS | Status: AC
Start: 2021-06-22 — End: ?

## 2021-06-29 MED ORDER — SODIUM CHLORIDE 0.9 % (FLUSH) INJECTION SYRINGE
10.0000 mL | INJECTION | Freq: Two times a day (BID) | INTRAMUSCULAR | Status: AC
Start: 2021-06-22 — End: ?

## 2021-07-01 ENCOUNTER — Other Ambulatory Visit: Payer: Self-pay

## 2021-07-02 ENCOUNTER — Encounter (INDEPENDENT_AMBULATORY_CARE_PROVIDER_SITE_OTHER): Payer: Self-pay | Admitting: Orthopaedic Trauma

## 2021-07-02 ENCOUNTER — Other Ambulatory Visit: Payer: Self-pay

## 2021-07-02 ENCOUNTER — Ambulatory Visit: Payer: MEDICAID | Attending: Orthopaedic Trauma | Admitting: Orthopaedic Trauma

## 2021-07-02 ENCOUNTER — Ambulatory Visit (HOSPITAL_BASED_OUTPATIENT_CLINIC_OR_DEPARTMENT_OTHER): Payer: MEDICAID | Admitting: Student in an Organized Health Care Education/Training Program

## 2021-07-02 ENCOUNTER — Encounter (INDEPENDENT_AMBULATORY_CARE_PROVIDER_SITE_OTHER): Payer: Self-pay | Admitting: Student in an Organized Health Care Education/Training Program

## 2021-07-02 VITALS — BP 181/89 | HR 85 | Temp 97.0°F | Ht 64.0 in | Wt 153.4 lb

## 2021-07-02 DIAGNOSIS — M869 Osteomyelitis, unspecified: Secondary | ICD-10-CM

## 2021-07-02 DIAGNOSIS — Z6826 Body mass index (BMI) 26.0-26.9, adult: Secondary | ICD-10-CM

## 2021-07-02 DIAGNOSIS — A498 Other bacterial infections of unspecified site: Secondary | ICD-10-CM

## 2021-07-02 DIAGNOSIS — S42301D Unspecified fracture of shaft of humerus, right arm, subsequent encounter for fracture with routine healing: Secondary | ICD-10-CM

## 2021-07-02 DIAGNOSIS — S42309A Unspecified fracture of shaft of humerus, unspecified arm, initial encounter for closed fracture: Secondary | ICD-10-CM | POA: Insufficient documentation

## 2021-07-02 DIAGNOSIS — Z09 Encounter for follow-up examination after completed treatment for conditions other than malignant neoplasm: Secondary | ICD-10-CM

## 2021-07-02 LAB — CBC WITH DIFF
BASOPHIL #: <0.1 10*3/uL (ref <=0.20)
BASOPHIL %: 1 %
EOSINOPHIL #: 0.22 10*3/uL (ref <=0.50)
EOSINOPHIL %: 3 %
HCT: 30.4 % — ABNORMAL LOW (ref 34.8–46.0)
HGB: 9.4 g/dL — ABNORMAL LOW (ref 11.5–16.0)
IMMATURE GRANULOCYTE #: <0.1 10*3/uL (ref <0.10)
IMMATURE GRANULOCYTE %: 0 % (ref 0–1)
LYMPHOCYTE #: 2.37 10*3/uL (ref 1.00–4.80)
LYMPHOCYTE %: 32 %
MCH: 29.2 pg (ref 26.0–32.0)
MCHC: 30.9 g/dL — ABNORMAL LOW (ref 31.0–35.5)
MCV: 94.4 fL (ref 78.0–100.0)
MONOCYTE #: 0.37 10*3/uL (ref 0.20–1.10)
MONOCYTE %: 5 %
MPV: 11 fL (ref 8.7–12.5)
NEUTROPHIL #: 4.38 10*3/uL (ref 1.50–7.70)
NEUTROPHIL %: 59 %
PLATELETS: 246 10*3/uL (ref 150–400)
RBC: 3.22 10*6/uL — ABNORMAL LOW (ref 3.85–5.22)
RDW-CV: 16.3 % — ABNORMAL HIGH (ref 11.5–15.5)
WBC: 7.4 10*3/uL (ref 3.7–11.0)

## 2021-07-02 LAB — HEPATIC FUNCTION PANEL
ALBUMIN: 3.4 g/dL — ABNORMAL LOW (ref 3.5–5.0)
ALKALINE PHOSPHATASE: 115 U/L — ABNORMAL HIGH (ref 40–110)
ALT (SGPT): 13 U/L (ref 8–22)
AST (SGOT): 15 U/L (ref 8–45)
BILIRUBIN DIRECT: <0.1 mg/dL — ABNORMAL LOW (ref 0.1–0.4)
BILIRUBIN TOTAL: 0.2 mg/dL — ABNORMAL LOW (ref 0.3–1.3)
PROTEIN TOTAL: 7.5 g/dL (ref 6.4–8.3)

## 2021-07-02 LAB — BUN: BUN: 23 mg/dL (ref 8–25)

## 2021-07-02 LAB — C-REACTIVE PROTEIN(CRP),INFLAMMATION: CRP INFLAMMATION: 5.4 mg/L (ref <8.0)

## 2021-07-02 LAB — CREATININE WITH EGFR
CREATININE: 0.73 mg/dL (ref 0.60–1.05)
ESTIMATED GFR: >90 mL/min/BSA (ref >=60)

## 2021-07-02 NOTE — Progress Notes (Signed)
PATIENT NAME: Brenda [name], Brenda [name]  HOSPITAL NUMBER:  [id number]  DATE OF SERVICE: 07/02/2021  DATE OF BIRTH:  1975/04/27    PROGRESS NOTE    DATES OF SURGERY:  1. June 14, 2021, I and D and ORIF of right humerus at outside hospital at [facility].    2. June 17, 2021, I and D of right medial elbow.    SUBJECTIVE:  Ms. Duey is here for followup.  Her surgeries were performed by an outside physician after a trial of nonoperative treatment of a humerus fracture.  She did have some positive culture results for Pseudomonas.  She has been on cefepime and Flagyl.  She sees Dr. Babette Relic at 4:00 p.m. today.  In regard to her arm, she has really been not doing too much rehab or therapy with that at this point.  She is getting home health for her PICC line, but had not started any PT.  She has been nonweightbearing as instructed.  She does state she has had some trouble sleeping.    OBJECTIVE:  On physical exam, her incision posteriorly and her medial elbow are clean and dry.  She has a little bit of erythema kind medially, but no fluctuance.  It is a slightly indurated, but improved from when she was in the hospital.  She has intact, radial, and ulnar median nerve distributions in her hand for motor and sensory exam with a 2+ radial pulse.  She has pretty limited elbow motion and forearm motion at this time.  No x-rays were taken today.    IMPRESSION AND PLAN:  We will remove her sutures and place Steri-Strips.  We gave her an order for physical therapy with coffee cup weightbearing lifting limit but range of motion of the shoulder, elbow, forearm, wrist, and hand.  They can work on edema control as well and modalities.  We will see her in 2 weeks with x-rays of the right humerus.  We did suggest melatonin and not napping during the day to try and get her sleep-wake cycle back on track.        Romie Minus, MD  Associate Professor  [place] Department of Orthopaedics              DD:  07/02/2021 15:12:23  DT:   07/02/2021 18:37:46 DG  D#:  [id number]

## 2021-07-03 ENCOUNTER — Other Ambulatory Visit: Payer: Self-pay

## 2021-07-04 ENCOUNTER — Other Ambulatory Visit: Payer: MEDICAID

## 2021-07-04 ENCOUNTER — Encounter (INDEPENDENT_AMBULATORY_CARE_PROVIDER_SITE_OTHER): Payer: Self-pay | Admitting: Student in an Organized Health Care Education/Training Program

## 2021-07-04 ENCOUNTER — Other Ambulatory Visit: Payer: Self-pay

## 2021-07-04 NOTE — Progress Notes (Signed)
Brenda [name]       OPAT Lab Monitoring Note    I have reviewed the patient's labs from 07/02/21 available in EMR pertaining to antimicrobial monitoring.   They are stable when compared to previous values.  CRP has normalized. May be candidate for early PO transition.   ID f/u planned for 07/20/21.     We will continue with the current course of treatment.       Clovis Pu MD  Section of Infectious Diseases  07/04/2021

## 2021-07-04 NOTE — Home Health (Signed)
Patient being seen for Cp assessment, wound care, and lab draw.   Upon arrival patient was sitting in room with mother present. Verified patient by name and DOB. Patient alert and oreinted but sleepy. Vitals obtained and WNl. Wounds dressed this morning by mother per patient. PICC line wouldn't get blood return for LPN last visit. Doc ordered cathflo. I tried to get blood return and got it with no issues. GOt blood from PICC for labs per order. Patient tolerated well. PICC dressing changes using steril technique. No issues. Patient on IV antibiotics. On the bag from [facility] it says end date 5/12. I contacted PCP and they said it should be June 12th. [facility] notfied and updated end date and theyre sending more.    Constitutional: No fever, chills or weakness   Skin: No rash or diaphoresis.  Wounds: Unable to assess. Mother dressed them today.  HENT: No headaches, dizziness or congestion  Eyes: No vision changes.  Cardio: No chest pain, palpitations or leg swelling   Respiratory: No cough, wheezing or SOB.  Lung sounds: CTA  GI:  No nausea, vomiting or stool changes. Bowel sounds present x 4  GU:  No urinary changes, no pain or burning.  MSK: No joint or back pain  Neuro: No seizures or LOC  All other systems reviewed and are negative.    Medications gathered and reviewed with prescriptions and what we have on the medication list.   Allergies reviewed.   Education provided on emergency prepardness, signs and symptoms of infection and when to report to RN or PCP, and on when to go to the ER.   Upon departure patient sitting in recliner. No questions or concerns. PAtient has appt on monday in [place] so her labs will be obtained there.

## 2021-07-04 NOTE — Home Health (Signed)
Upon nurse arrival, pt mother met this nurse at the door, pt was seated in her recliner chair, ID by name and DOB, pt is currently living with her parents for assistance. pt is seen weekly for cp assesment, wound care, PICC line mngt.   Obtained vitals, pt reported pain 5/10 at this time, performed cp assesment, no issues were noted at this time. pt had recieved orders for PT, picture uploaded into chart, supervisor and RN notified as well as PTA. Performed Picc line dressing change, pt had her labs drawn on monday. PICC flushed without difficulty, no blood return was present, after PICC line dressing change, PICC was flushed with 33m of NS and 588mof heparin. pt and family had no questions, comments, concerns upon nurse departure.

## 2021-07-05 ENCOUNTER — Other Ambulatory Visit: Payer: MEDICAID

## 2021-07-05 ENCOUNTER — Other Ambulatory Visit: Payer: Self-pay

## 2021-07-05 NOTE — Home Health (Signed)
Received call from call center that patients PICC line was not flushing. Called patient and she stated she could not get it to flush. Arrived at patients house and patient was sitting in chair with mom present. Patient stated she tried to flushe her Picc line and nothing would go through. This nurse removed the dressing and the PICC line was pinched where it goed in her arm. repoitioned line and put dressing back on all using sterile techniqe and if flushed then she done her antibioitcs. Patient was sitting in chiar with mom present upon my departure from home.

## 2021-07-06 ENCOUNTER — Other Ambulatory Visit: Payer: MEDICAID

## 2021-07-06 ENCOUNTER — Other Ambulatory Visit: Payer: Self-pay

## 2021-07-07 ENCOUNTER — Other Ambulatory Visit: Payer: Self-pay

## 2021-07-08 ENCOUNTER — Other Ambulatory Visit: Payer: Self-pay

## 2021-07-09 ENCOUNTER — Other Ambulatory Visit: Payer: MEDICAID

## 2021-07-09 ENCOUNTER — Other Ambulatory Visit: Payer: Self-pay

## 2021-07-09 LAB — ENTER/EDIT OPAT LABS
ALKALINE PHOSPHATASE: 98
ALT (SGPT): 18
AST (SGOT): 25
BASOPHILS: 0.3
BILIRUBIN, TOTAL: 0.4
BUN: 36
C-REACTIVE PROTEIN HIGH SENSITIVITY (INFLAMMATION): 6.6
CREATININE: 0.9
EOSINOPHIL: 3.4
HCT: 35.1
HGB: 10.7
LYMPHOCYTES: 37.3
MONOCYTES: 4.9
PLATELET COUNT: 237
PMN ABS: 4.99
PMN'S: 53.7
WBC: 9.3

## 2021-07-09 NOTE — Home Health (Signed)
SN for right arm osteomylitis. Upon arrival at home patients mother let this nurse in home. Patient noted to be sitting in chair. Patient identified by name and DOB. Home noted to be clean and tidy as well as patient. Vitals within normal limits with the exception of blood pressure which is slightly elevated at this time. Patient is noted to be having pain in right arm at 5/10 PCP notified. Patient reports that she seems to be having more pain than she did earlier in recovery. Small amount of edema noted to inner side of the elbow. Hand hygiene performed. PICC line dressing changed via sterile technique. Patient tolerated well. Some tenderness noted to PICC line isnertion area. No redness, drainage or odor noted to area. Labs obtained via PICC line. PICC line patient and flushes easily and without difficulty. EDucated patient and mother that if patients pain continues to increase she should notified us or call her PCP. Both stated udnerstanding.

## 2021-07-09 NOTE — Home Health (Signed)
SN for osteomylitis of right arm. Upon arrival at home patients mother let this nurse in home. Home noted to be clean and tidy. Patient as well. Vitals within normal limits. lungs CTA. Bowel sounds positive x4 quads. Patient reports arm pain of 4/10. Hand hygiene performed. Incisions look well. Free from redness and drainage. Stitches have been removed at docotrs office this week. Educated patient to keep area clean and dry. Patient stated understanding.

## 2021-07-10 ENCOUNTER — Other Ambulatory Visit: Payer: Self-pay

## 2021-07-11 ENCOUNTER — Other Ambulatory Visit: Payer: MEDICAID

## 2021-07-11 ENCOUNTER — Other Ambulatory Visit: Payer: Self-pay

## 2021-07-12 ENCOUNTER — Other Ambulatory Visit: Payer: Self-pay

## 2021-07-12 ENCOUNTER — Encounter (INDEPENDENT_AMBULATORY_CARE_PROVIDER_SITE_OTHER): Payer: Self-pay | Admitting: Student in an Organized Health Care Education/Training Program

## 2021-07-12 ENCOUNTER — Other Ambulatory Visit: Payer: MEDICAID
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
## 2021-07-12 NOTE — Progress Notes (Signed)
Brenda [name]    OPAT Lab Monitoring Note    I have reviewed the patient's labs from 07/09/21 available in [facility].   They are stable when compared to previous values.  WBC 9.3, Hb 10.7, HCt 35.1, Plt 237. ANC 4.99. Eos 3.4%. Cr 0.9, BUN 36. HFP WNL. CRP 6.6, normal.     We will continue with the current course of treatment.       Johny Chess MD  Section of Infectious Diseases  07/12/2021

## 2021-07-13 ENCOUNTER — Other Ambulatory Visit: Payer: Self-pay

## 2021-07-13 ENCOUNTER — Encounter (INDEPENDENT_AMBULATORY_CARE_PROVIDER_SITE_OTHER): Payer: Self-pay | Admitting: Student in an Organized Health Care Education/Training Program

## 2021-07-13 NOTE — Home Health (Signed)
SN for osteomylitis of right humerous. Upon arrival at home mother let this nurse in home. Patient identified by name and DOB. Home noted to be clean and tidy as well as patient. Vitals within normal limits. Bowel sounds positive x4 quads. Lungs CTA but decreased. Patient reports that her arm is hurting worse than it has been. Patient also noted to have started PT with home health. Discussed with patient that PT and exercise with the arm will likely make it hurt/ache more at the beginning. Also that she needed to keep moving it or it would become still and not movable. Patient and mother stated udnerstanding. Patient reports hair loss. Educated patient that stress and being ill can cause hair loss. Patient reprots that shes been stressed about her arm lately and that was the cause of it.
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
## 2021-07-16 ENCOUNTER — Other Ambulatory Visit: Payer: Self-pay

## 2021-07-17 ENCOUNTER — Other Ambulatory Visit: Payer: MEDICAID

## 2021-07-17 ENCOUNTER — Other Ambulatory Visit: Payer: Self-pay

## 2021-07-17 LAB — ENTER/EDIT OPAT LABS
ALKALINE PHOSPHATASE: 83
ALT (SGPT): 14
AST (SGOT): 19
BASOPHILS: 0.4
BILIRUBIN, TOTAL: 0.3
BUN: 28
C-REACTIVE PROTEIN (CRP),INFLAMMATION: 8 mg/L (ref 0–10)
CREATININE: 0.9
EOSINOPHIL: 4.4
HCT: 32.7
HGB: 10.1
LYMPHOCYTES: 40.8
MONOCYTES: 5.4
PLATELET COUNT: 209
PMN ABS: 3.33
PMN'S: 48.7
WBC: 6.8

## 2021-07-17 NOTE — Home Health (Signed)
upon arrival, pt was in her bedroom, pt mother had met this nurse at the door, ID by name and DOB, pt is seen for PICC line care, CP assesment.   Obtained vitals, pt reported pain 5/10 at this time in her left arm. Performed CP assesment, pt did report she was getting SOB periodically. Denied any issues with bowels or bladder. Denied any changes to medications or insurance since last [facility] visit.   Chnaged PICC line dressing, obtained lab specimen from PICC line, labeled tubes.   pt had no questions, comments, concerns upon nurse departure.

## 2021-07-18 ENCOUNTER — Ambulatory Visit: Payer: Self-pay

## 2021-07-18 ENCOUNTER — Other Ambulatory Visit: Payer: MEDICAID

## 2021-07-18 ENCOUNTER — Other Ambulatory Visit: Payer: Self-pay

## 2021-07-18 NOTE — Home Health (Signed)
Patient seen today for [facility] PTA visit. Patient identified via name and DOB. Vitals measured and WNL. BP taken in L ankle this date secondary to R arm fracture and L arm PICC line. Patient currently reports 3/10 pain. However did report that following a shower it had increased to 8/10. Continued with current POC. Patient remains independent with transfers and ambulation without AD but requires assistance with ADL's secondary to R arm fracture and L arm PICC line. Performed AROM/PROM per POC today. Patient still very limited with AROM and guards a lot with PROM however more motion is obtained with passive movement. Patient required education on proper breathing as she tended to hold breath during PROM. Patient did better with education. Patient advised to count reps out loud during AROM to help avoid holding breath. Continued education on safety (home environment,  orthopedic precautions, PICC line precautions, infection precautions),HEP, and when and how to contact agency or seek emergency care. Both patient and caregiver verbalized understanding. Patient remains homebound secondary to lack of BUE function affecting her independence with ADL's. Patient will benefit from continued skilled services to reach goals established within the POC. [ Questioned supervising PT about possibly adding scar massage and pulleys to aid in ROM.]

## 2021-07-19 ENCOUNTER — Encounter (INDEPENDENT_AMBULATORY_CARE_PROVIDER_SITE_OTHER): Payer: Self-pay | Admitting: Student in an Organized Health Care Education/Training Program

## 2021-07-19 ENCOUNTER — Other Ambulatory Visit: Payer: Self-pay

## 2021-07-19 ENCOUNTER — Non-Acute Institutional Stay: Payer: MEDICAID | Attending: NURSE PRACTITIONER

## 2021-07-19 ENCOUNTER — Other Ambulatory Visit (INDEPENDENT_AMBULATORY_CARE_PROVIDER_SITE_OTHER): Payer: Self-pay | Admitting: Orthopaedic Trauma

## 2021-07-19 DIAGNOSIS — S42309A Unspecified fracture of shaft of humerus, unspecified arm, initial encounter for closed fracture: Secondary | ICD-10-CM

## 2021-07-19 DIAGNOSIS — M869 Osteomyelitis, unspecified: Secondary | ICD-10-CM

## 2021-07-19 NOTE — Progress Notes (Signed)
Called [facility] regarding labs, left message for callback.    Event organiser  OPAT/Stewardship

## 2021-07-19 NOTE — Home Health (Signed)
SN for acute osteomylitis of right arm. Upon arrival at home patients mother let this nurse in home. Patient identified by name and DOB. Home noted to be clean and tidy as well as patient. Vitals within normal limits with the exception of blood pressure. Blood pressure noted to be increased at 155/85. Patient reports that shes in a lot of pain today. 7/10 to right arm. Bowel sounds positive x4 quads. Lungs CTA but decreased throughout. Patient reports that she is gaining mucsle mass back in her arm. Incisions to arms look great with no redness and/or drainage noted. Patient reports increased hair loss. Edcuated patient that stress and some medications can cause hair loss. Patient stated understanding.

## 2021-07-19 NOTE — Progress Notes (Signed)
Spoke to [facility], they will reach out to [facility] for results and fax to OPAT.      Event organiser  OPAT/Stewardship

## 2021-07-19 NOTE — Result Encounter Note (Signed)
OPAT Lab Monitoring Note    I have reviewed the patient's OPAT labs.   They are stable when compared to previous values.  ID apt tomorrow.     We will continue with the current course of treatment.       Johny Chess MD  Section of Infectious Diseases  07/19/2021

## 2021-07-20 ENCOUNTER — Encounter (INDEPENDENT_AMBULATORY_CARE_PROVIDER_SITE_OTHER): Payer: Self-pay | Admitting: Student in an Organized Health Care Education/Training Program

## 2021-07-20 ENCOUNTER — Encounter (INDEPENDENT_AMBULATORY_CARE_PROVIDER_SITE_OTHER): Payer: Self-pay | Admitting: Orthopaedic Trauma

## 2021-07-20 ENCOUNTER — Other Ambulatory Visit: Payer: Self-pay

## 2021-07-20 ENCOUNTER — Ambulatory Visit: Payer: MEDICAID | Attending: Orthopaedic Trauma | Admitting: Orthopaedic Trauma

## 2021-07-20 ENCOUNTER — Other Ambulatory Visit: Payer: MEDICAID

## 2021-07-20 ENCOUNTER — Other Ambulatory Visit (INDEPENDENT_AMBULATORY_CARE_PROVIDER_SITE_OTHER): Payer: Self-pay | Admitting: Orthopaedic Trauma

## 2021-07-20 ENCOUNTER — Ambulatory Visit (HOSPITAL_BASED_OUTPATIENT_CLINIC_OR_DEPARTMENT_OTHER): Payer: MEDICAID | Admitting: Student in an Organized Health Care Education/Training Program

## 2021-07-20 ENCOUNTER — Inpatient Hospital Stay (HOSPITAL_BASED_OUTPATIENT_CLINIC_OR_DEPARTMENT_OTHER)
Admission: RE | Admit: 2021-07-20 | Discharge: 2021-07-20 | Disposition: A | Discharge status: Home / Self Care | Payer: MEDICAID | Source: Ambulatory Visit

## 2021-07-20 ENCOUNTER — Other Ambulatory Visit (HOSPITAL_BASED_OUTPATIENT_CLINIC_OR_DEPARTMENT_OTHER): Payer: MEDICAID

## 2021-07-20 VITALS — BP 150/116 | HR 110 | Temp 98.2°F | Ht 64.0 in | Wt 158.6 lb

## 2021-07-20 DIAGNOSIS — A498 Other bacterial infections of unspecified site: Secondary | ICD-10-CM

## 2021-07-20 DIAGNOSIS — Z6827 Body mass index (BMI) 27.0-27.9, adult: Secondary | ICD-10-CM

## 2021-07-20 DIAGNOSIS — S42301D Unspecified fracture of shaft of humerus, right arm, subsequent encounter for fracture with routine healing: Secondary | ICD-10-CM

## 2021-07-20 DIAGNOSIS — M869 Osteomyelitis, unspecified: Secondary | ICD-10-CM | POA: Insufficient documentation

## 2021-07-20 DIAGNOSIS — S42309A Unspecified fracture of shaft of humerus, unspecified arm, initial encounter for closed fracture: Secondary | ICD-10-CM

## 2021-07-20 MED ORDER — LEVOFLOXACIN 500 MG TABLET
500.0000 mg | ORAL_TABLET | Freq: Every day | ORAL | 2 refills | Status: AC
Start: 2021-07-30 — End: 2021-10-28
  Filled 2021-07-20: qty 30, 30d supply, fill #0
  Filled 2021-08-17: qty 30, 30d supply, fill #1
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
## 2021-07-22 ENCOUNTER — Other Ambulatory Visit: Payer: Self-pay

## 2021-07-23 ENCOUNTER — Other Ambulatory Visit: Payer: Self-pay

## 2021-07-23 ENCOUNTER — Other Ambulatory Visit: Payer: MEDICAID

## 2021-07-23 ENCOUNTER — Encounter (INDEPENDENT_AMBULATORY_CARE_PROVIDER_SITE_OTHER): Payer: Self-pay | Admitting: Student in an Organized Health Care Education/Training Program

## 2021-07-23 LAB — ENTER/EDIT OPAT LABS
ALKALINE PHOSPHATASE: 73
ALT (SGPT): 15
AST (SGOT): 21
BASOPHILS: 0.4
BILIRUBIN, TOTAL: 0.3
BUN: 26
C-REACTIVE PROTEIN HIGH SENSITIVITY (INFLAMMATION): 8 mg/l (ref 0.0–10)
CREATININE: 0.8
EOSINOPHIL: 3
HCT: 33.6
HGB: 10.8
LYMPHOCYTES: 33.1
MONOCYTES: 6
PLATELET COUNT: 225
PMN ABS: 4.2
PMN'S: 57.2
WBC: 7.3

## 2021-07-23 NOTE — Home Health (Signed)
Patient seen today for [facility] PTA visit. Patient identified via name and DOB. Vitals measured and WNL. BP taken in L ankle this date secondary to R arm fracture and L arm PICC line. Patient currently reports 5/10 pain. Continued with current POC. Patient remains independent with transfers and ambulation without AD but requires assistance with ADL's secondary to R arm fracture and L arm PICC line. Performed AROM/PROM per POC today. Patient has increased AROM today but continues to guard a lot with PROM. Continued education on safety (home environment, orthopedic precautions, PICC line precautions, infection precautions),HEP, and when and how to contact agency or seek emergency care. Both patient and caregiver verbalized understanding. Patient remains homebound secondary to lack of BUE function affecting her independence with ADL's. Patient will benefit from continued skilled services to reach goals established within the POC. [Patient presented with updated script for therapy. Took photo and sent to supervising PT and nursing supervisor via [place].]

## 2021-07-24 ENCOUNTER — Encounter (INDEPENDENT_AMBULATORY_CARE_PROVIDER_SITE_OTHER): Payer: Self-pay | Admitting: Student in an Organized Health Care Education/Training Program

## 2021-07-24 ENCOUNTER — Other Ambulatory Visit: Payer: Self-pay

## 2021-07-24 NOTE — Result Encounter Note (Signed)
OPAT Lab Monitoring Note    I have reviewed the patient's OPAT labs.   They are stable when compared to previous values.  ID note reviewed. Plan to transition to Levofloxacin suppression on 6/12 after IV abx completion.    We will continue with the current course of treatment.       Clovis Pu MD  Section of Infectious Diseases  07/24/2021

## 2021-07-25 ENCOUNTER — Other Ambulatory Visit: Payer: Self-pay

## 2021-07-25 ENCOUNTER — Encounter (INDEPENDENT_AMBULATORY_CARE_PROVIDER_SITE_OTHER): Payer: Self-pay | Admitting: Student in an Organized Health Care Education/Training Program

## 2021-07-25 ENCOUNTER — Other Ambulatory Visit: Payer: MEDICAID

## 2021-07-25 NOTE — Home Health (Signed)
Patient seen today for [facility] PTA visit. Patient identified via name and DOB. Vitals measured and WNL (BP somewhat elevated today) No reports of recent falls or medication/insurance changes.Patient with 9/10 pain today in L arm which also houses PICC line. Contacted HH office to speak with nursing. No redness or swelling noted at PICC site. Nursing advised patient to flush line to make sure no resistance. Patient did this and PICC line flushed normal. Nursing staff to contact PCP to determine further action regarding pain and inform patient.Made clinical decision to hold some activities in POC today secondary to patient's pain level (Patient was able to tolerated limited ROM activities today). Patient advised to seek medical attention if pain persists or worsens or is accompanied by fever, nausea, chest pain, etc. Patient verbalized understanding. Patient also reported pulling stitch from R elbow and showed me a photo.Says she can feel another stitch working its way out. Patient advised to refrain from pulling/picking at such as precaution to infection.Continued education on safety (home environment, orthopedic precautions, PICC line precautions, infection precautions),HEP, and when and how to contact agency or seek emergency care. Both patient and caregiver verbalized understanding. Patient remains homebound secondary to lack of BUE function affecting her independence with ADL's. Patient will benefit from continued skilled services to reach goals established within the POC. [ Patient also advised to make appointment with PCP regarding need for F2F so PCP can continue to sign for [facility] orders. Patient said she would call PCP to schedule visit tomorrow.Caregiver (patient's mother) also aware of today's treatment and plan and noted that patient has not seen PCP for quite some time. Caregiver also advised of need and importance of F2F documentation and its role in [facility].]

## 2021-07-25 NOTE — Home Health (Signed)
SN for osteomylitis. Upon arrival at home patients mother let this nurse in home. Patient identified by name and DOB. Home noted to be clean and tidy as well as patient. Vitals noted to be within normal limits with the exception of blood pressure which was elevated. Patient denies shortness of breath and/or chest pain at this time. Lungs CTA. Bowel sounds positive x4 quads. Patient does report arm pain at 5/10 at this time. PCP notified at this time. PTA in home at this time and aware of increased BP and pain level. Hand hygiene performed. PICC line dressing changed via sterile technique. Blood sample taken at this time. PICC line flsushed easily and without diffculty. Patient tolerated well. Patient reported that she is to start oral antibiotics next week and was approved to have her PICC line pulled after they arrival next week.

## 2021-07-25 NOTE — Progress Notes (Signed)
[facility] HH nurse, Tresa Endo called, [phone], pt states  her PICC line area hurts 9 out of 10 . Nurse said it is not red, hot or swollen,  flushes easily and blood return is great. Discussed w/ Dr. Laural Benes. As pt is days away from end date, ok to transition to PO Levofloxacin 500 mg daily prescribed at ID apt and remove PICC Line. Communicated to [facility] nurse who will ensure PO has been filled and go over plan w/ pt. Pt d/c from OPAT.      Velda Shell  LPN  OPAT Nurse   [phone]

## 2021-07-26 ENCOUNTER — Other Ambulatory Visit: Payer: MEDICAID

## 2021-07-26 ENCOUNTER — Other Ambulatory Visit: Payer: Self-pay

## 2021-07-26 ENCOUNTER — Telehealth (INDEPENDENT_AMBULATORY_CARE_PROVIDER_SITE_OTHER): Payer: Self-pay | Admitting: Orthopaedic Trauma

## 2021-07-26 MED ORDER — LEVOFLOXACIN 500 MG TABLET
500.0000 mg | ORAL_TABLET | Freq: Every day | ORAL | Status: AC
Start: 2021-07-26 — End: ?

## 2021-07-26 NOTE — Nursing Note (Signed)
Hello. I am a nurse with [facility]. I seen this patient recently and she reported that the pain to her elbow/right arm is slowing increasing not decreasing. No redness, drianage, warmth, etc. noted. I was just letting you know.     Thanks!     Bramer, Chauncey Mann, MD  Kathie Rhodes, RN  Hi Josh   See message below from home health nurse   Can you call the patient please and see whats going on     Thanks              Called patient + EC multiple times - left voicemail for call back.     Kathie Rhodes, RN
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
## 2021-07-27 ENCOUNTER — Other Ambulatory Visit: Payer: Self-pay

## 2021-07-27 ENCOUNTER — Other Ambulatory Visit: Payer: MEDICAID

## 2021-07-27 NOTE — Home Health (Signed)
Patient seen today for [facility] PTA visit. Patient identified via name and DOB. Vitals measured and WNL. Patient currently reports 5/10 pain. Continued with current POC. Patient remains independent with transfers and ambulation without AD but requires assistance with ADL's secondary to R arm fracture. Performed AROM/PROM per POC today.Continued education on safety (home environment, orthopedic precautions, PICC line precautions, infection precautions),HEP, and when and how to contact agency or seek emergency care. Both patient and caregiver verbalized understanding. Patient remains homebound secondary to lack of BUE function affecting her independence with ADL's. Patient will benefit from continued skilled services to reach goals established within the POC.

## 2021-07-28 DIAGNOSIS — Z9889 Other specified postprocedural states: Secondary | ICD-10-CM

## 2021-07-28 DIAGNOSIS — S42301A Unspecified fracture of shaft of humerus, right arm, initial encounter for closed fracture: Secondary | ICD-10-CM

## 2021-07-28 DIAGNOSIS — S42252A Displaced fracture of greater tuberosity of left humerus, initial encounter for closed fracture: Secondary | ICD-10-CM

## 2021-07-30 ENCOUNTER — Other Ambulatory Visit: Payer: MEDICAID

## 2021-07-30 ENCOUNTER — Other Ambulatory Visit: Payer: Self-pay

## 2021-07-30 NOTE — Home Health (Signed)
Patient seen today for [facility] PTA visit. Patient identified via name and DOB. Vitals measured and WNL. Patient currently reports 5/10 pain. Continued with current POC. Patient remains independent with transfers and ambulation without AD but requires assistance with ADL's secondary to R arm fracture. Performed AROM/PROM per POC today.Continued education on safety (home environment, orthopedic precautions, PICC line precautions, infection precautions),HEP, and when and how to contact agency or seek emergency care. Both patient and caregiver verbalized understanding. Patient remains homebound secondary to lack of BUE function affecting her independence with ADL's. Patient will benefit from continued skilled services to reach goals established within the POC.[Patient has follow-up visit with PCP scheduled on Thursday 08/02/2021.]

## 2021-07-31 ENCOUNTER — Other Ambulatory Visit: Payer: Self-pay

## 2021-08-01 ENCOUNTER — Other Ambulatory Visit: Payer: Self-pay

## 2021-08-01 ENCOUNTER — Other Ambulatory Visit: Payer: MEDICAID

## 2021-08-01 NOTE — Home Health (Signed)
Patient seen today for [facility] PTA visit. Patient identified via name and DOB. Vitals measured and WNL. Patient currently reports 4/10 pain. Continued with current POC. Patient remains independent with transfers and ambulation without AD but requires assistance with ADL's secondary to R arm fracture. Performed AROM/PROM per POC today.Continued education on safety including home environment, orthopedic precautions,HEP, and when and how to contact agency or seek emergency care. Both patient and caregiver verbalized understanding. Patient remains homebound secondary to lack of BUE function affecting her independence with ADL's. Patient will benefit from continued skilled services to reach goals established within the POC. [Patient did note having increase in asthma attacks lately and she was advised to consult with her PCP at her appointment tomorrow 08/02/2021. Reinforced safety with any breathing issues including how and when to contact [facility] agency or seek emergency care and both patient and caregiver verbalized understanding.]

## 2021-08-02 ENCOUNTER — Other Ambulatory Visit: Payer: Self-pay

## 2021-08-03 ENCOUNTER — Other Ambulatory Visit: Payer: MEDICAID

## 2021-08-03 ENCOUNTER — Other Ambulatory Visit: Payer: Self-pay

## 2021-08-03 NOTE — Home Health (Signed)
Patient seen today for [facility] PTA visit. Patient identified via name and DOB. Vitals measured and WNL. Patient currently reports 5/10 pain. Continued with current POC. Patient remains independent with transfers and ambulation without AD but requires assistance with ADL's secondary to R arm fracture. Performed AROM/PROM per POC today.Continued education on safety including home environment, orthopedic precautions,HEP, and when and how to contact agency or seek emergency care. Both patient and caregiver verbalized understanding. Patient remains homebound secondary to lack of BUE function affecting her independence with ADL's. Patient will benefit from continued skilled services to reach goals established within the POC. [Patient noted she had follow-up with PCP and was given script for Zoloft. Patient has not picked up from pharmacy yet to start taking it so it was not added to med list. Patient advised to inform next clinician in home of when she has started it for it to be added. She verbalized understanding.]

## 2021-08-05 ENCOUNTER — Other Ambulatory Visit: Payer: Self-pay

## 2021-08-06 ENCOUNTER — Other Ambulatory Visit: Payer: MEDICAID

## 2021-08-06 ENCOUNTER — Other Ambulatory Visit: Payer: Self-pay

## 2021-08-06 ENCOUNTER — Telehealth (INDEPENDENT_AMBULATORY_CARE_PROVIDER_SITE_OTHER): Payer: Self-pay | Admitting: Orthopaedic Trauma

## 2021-08-06 MED ORDER — ACETAMINOPHEN-CAFFEINE ORAL
260.0000 mg | Freq: Two times a day (BID) | ORAL | Status: AC | PRN
Start: 2021-08-06 — End: ?

## 2021-08-06 NOTE — Home Health (Signed)
Paitent not home upon arrival, her father is home and greeted me upon arrival, he notes she will be home shortly form town for visit. Patient presents today for [facility] PT reassessment visit for limited BUE ROM s/p ORIF with complications. Patient identified via name and DOB. Patient denies changes to insurance, medications, or falls. Upon review of medication the following updates were made: patient no longer taking Flagyl, senokot, or Tylenol and are removed from list; acetaminophen with caffeine added to medication list as patient is taking instead of Tylenol. Patient reports being pleased with care provided by PTA. Patient reports that she has upcoming appointment with Dr. Fransisca Connors on 6/30. Patient verbalizes understanding of lifting precaution no greater than 5 lbs. Called and left message with Jerelene Redden office notifying of pain rated 7/10 and BP 146/90.     Patient presents today with elevated BP and pain ratings which are reported to PCP's office. Patient demosntrates good improvements to AROM/PROM as compared to previous assessment. Patient has completed IV antibiotics and is currently taking continued antibiotics orally. She demonstrates independent ambulation and ability to come and go from home as desired with no lack of transportation as her mother is available to take her to appointments. Patient currently has ability to continued PT in outpatient setting with no barriers at this time. Dr. Su Monks office called and outpatient PT order requested in [place]. Plan for D/C today from [facility] PT for patient to transition to outpatient PT services.

## 2021-08-06 NOTE — Home Health (Signed)
Patient being DC from Skilled nursing. PICC was removed last week. No more IV antibiotics. PAtient's wounds are healed completely. PAtient is now just benefiting from PT but no longer needs SN.

## 2021-08-06 NOTE — Nursing Note (Signed)
Message from Kandis Mannan sent at 08/06/2021 11:09 AM EDT    Summary: outpatient PT order needed    Suezanne Cheshire, home health PT called - pt is doing well and is is ready to move to outpatient PT. Please fax order to [facility] at [phone]; phone: [phone]                 Call History     Type Contact Phone/Fax User   08/06/2021 11:07 AM EDT Phone (Incoming) Suezanne Cheshire [facility] Nurse) [phone] Kandis Mannan   08/06/2021 11:06 AM EDT Phone (Incoming) Suezanne Cheshire [facility] Nurse) [phone] Kandis Mannan     outpatient PT order needed  Received: Today  Suszanne Conners Dr Fransisca Connors Service    Printed and faxed.    Kathie Rhodes, RN

## 2021-08-07 ENCOUNTER — Other Ambulatory Visit: Payer: Self-pay

## 2021-08-08 ENCOUNTER — Other Ambulatory Visit: Payer: Self-pay

## 2021-08-09 ENCOUNTER — Other Ambulatory Visit: Payer: Self-pay

## 2021-08-13 ENCOUNTER — Other Ambulatory Visit: Payer: Self-pay

## 2021-08-16 ENCOUNTER — Other Ambulatory Visit: Payer: Self-pay

## 2021-08-17 ENCOUNTER — Encounter (INDEPENDENT_AMBULATORY_CARE_PROVIDER_SITE_OTHER): Payer: Self-pay | Admitting: Orthopaedic Trauma

## 2021-08-17 ENCOUNTER — Ambulatory Visit: Payer: MEDICAID | Attending: Orthopaedic Trauma | Admitting: Orthopaedic Trauma

## 2021-08-17 ENCOUNTER — Other Ambulatory Visit: Payer: Self-pay

## 2021-08-17 ENCOUNTER — Other Ambulatory Visit (INDEPENDENT_AMBULATORY_CARE_PROVIDER_SITE_OTHER): Payer: MEDICAID | Admitting: Rheumatology

## 2021-08-17 ENCOUNTER — Inpatient Hospital Stay (HOSPITAL_BASED_OUTPATIENT_CLINIC_OR_DEPARTMENT_OTHER)
Admission: RE | Admit: 2021-08-17 | Discharge: 2021-08-17 | Disposition: A | Discharge status: Home / Self Care | Payer: MEDICAID | Source: Ambulatory Visit

## 2021-08-17 DIAGNOSIS — S42309A Unspecified fracture of shaft of humerus, unspecified arm, initial encounter for closed fracture: Secondary | ICD-10-CM

## 2021-08-17 DIAGNOSIS — S42301D Unspecified fracture of shaft of humerus, right arm, subsequent encounter for fracture with routine healing: Secondary | ICD-10-CM
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
## 2021-08-19 LAB — VITAMIN D 25 TOTAL: VITAMIN D, 25OH: 17 ng/mL — ABNORMAL LOW (ref 30–100)

## 2021-08-20 ENCOUNTER — Other Ambulatory Visit: Payer: Self-pay

## 2021-08-22 DIAGNOSIS — S42301D Unspecified fracture of shaft of humerus, right arm, subsequent encounter for fracture with routine healing: Secondary | ICD-10-CM

## 2021-08-31 ENCOUNTER — Other Ambulatory Visit (INDEPENDENT_AMBULATORY_CARE_PROVIDER_SITE_OTHER): Payer: Self-pay | Admitting: Orthopaedic Trauma

## 2021-08-31 DIAGNOSIS — S42309A Unspecified fracture of shaft of humerus, unspecified arm, initial encounter for closed fracture: Secondary | ICD-10-CM

## 2021-09-03 ENCOUNTER — Ambulatory Visit (INDEPENDENT_AMBULATORY_CARE_PROVIDER_SITE_OTHER): Payer: MEDICAID | Admitting: Orthopaedic Trauma

## 2021-09-04 ENCOUNTER — Telehealth (INDEPENDENT_AMBULATORY_CARE_PROVIDER_SITE_OTHER): Payer: Self-pay | Admitting: Orthopaedic Trauma

## 2021-09-04 ENCOUNTER — Encounter (INDEPENDENT_AMBULATORY_CARE_PROVIDER_SITE_OTHER): Payer: Self-pay | Admitting: Physician Assistant

## 2021-09-04 NOTE — Nursing Note (Signed)
Message from Hermine Messick sent at 09/04/2021 9:38 AM EDT    I tried contacting [facility] to get her MRI scheduled but her phone is not in service. I will mail her a letter and that will be the last attempt to get her scheduled for it.        Call History     Type Contact Phone/Fax User   09/04/2021 09:37 AM EDT Phone ([address]) [place], [place] (Self) [phone] Judie Petit) Thurman Coyer, Skip Mayer, RN

## 2021-11-05 ENCOUNTER — Ambulatory Visit (INDEPENDENT_AMBULATORY_CARE_PROVIDER_SITE_OTHER): Payer: Self-pay | Admitting: Student in an Organized Health Care Education/Training Program
